# Patient Record
Sex: Female | Born: 1975
Health system: Southern US, Community
[De-identification: ages and names within clinical notes are randomized; demographics above are authoritative.]

## PROBLEM LIST (undated history)

## (undated) DIAGNOSIS — J302 Other seasonal allergic rhinitis: Secondary | ICD-10-CM

## (undated) DIAGNOSIS — J45909 Unspecified asthma, uncomplicated: Secondary | ICD-10-CM

## (undated) DIAGNOSIS — D649 Anemia, unspecified: Secondary | ICD-10-CM

## (undated) DIAGNOSIS — I824Y2 Acute embolism and thrombosis of unspecified deep veins of left proximal lower extremity: Principal | ICD-10-CM

## (undated) HISTORY — DX: Acute embolism and thrombosis of unspecified deep veins of left proximal lower extremity: I82.4Y2

## (undated) HISTORY — PX: TOOTH EXTRACTION: SUR596

## (undated) HISTORY — PX: WISDOM TOOTH EXTRACTION: SHX21

---

## 1998-05-21 ENCOUNTER — Emergency Department (HOSPITAL_COMMUNITY): Admission: EM | Admit: 1998-05-21 | Discharge: 1998-05-21 | Payer: Self-pay | Admitting: Emergency Medicine

## 1999-10-23 ENCOUNTER — Inpatient Hospital Stay (HOSPITAL_COMMUNITY): Admission: EM | Admit: 1999-10-23 | Discharge: 1999-10-23 | Payer: Self-pay | Admitting: *Deleted

## 1999-10-28 ENCOUNTER — Emergency Department (HOSPITAL_COMMUNITY): Admission: EM | Admit: 1999-10-28 | Discharge: 1999-10-28 | Payer: Self-pay | Admitting: Emergency Medicine

## 1999-11-16 ENCOUNTER — Emergency Department (HOSPITAL_COMMUNITY): Admission: EM | Admit: 1999-11-16 | Discharge: 1999-11-16 | Payer: Self-pay | Admitting: Emergency Medicine

## 2001-03-01 ENCOUNTER — Emergency Department (HOSPITAL_COMMUNITY): Admission: EM | Admit: 2001-03-01 | Discharge: 2001-03-01 | Payer: Self-pay

## 2001-03-04 ENCOUNTER — Other Ambulatory Visit: Admission: RE | Admit: 2001-03-04 | Discharge: 2001-03-04 | Payer: Self-pay | Admitting: Obstetrics and Gynecology

## 2001-09-13 ENCOUNTER — Inpatient Hospital Stay (HOSPITAL_COMMUNITY): Admission: AD | Admit: 2001-09-13 | Discharge: 2001-09-15 | Payer: Self-pay | Admitting: Obstetrics and Gynecology

## 2001-10-25 ENCOUNTER — Other Ambulatory Visit: Admission: RE | Admit: 2001-10-25 | Discharge: 2001-10-25 | Payer: Self-pay | Admitting: Obstetrics and Gynecology

## 2002-04-25 ENCOUNTER — Emergency Department (HOSPITAL_COMMUNITY): Admission: EM | Admit: 2002-04-25 | Discharge: 2002-04-25 | Payer: Self-pay

## 2002-11-10 ENCOUNTER — Other Ambulatory Visit: Admission: RE | Admit: 2002-11-10 | Discharge: 2002-11-10 | Payer: Self-pay | Admitting: Obstetrics and Gynecology

## 2002-12-13 ENCOUNTER — Emergency Department (HOSPITAL_COMMUNITY): Admission: EM | Admit: 2002-12-13 | Discharge: 2002-12-13 | Payer: Self-pay | Admitting: Emergency Medicine

## 2003-03-02 ENCOUNTER — Emergency Department (HOSPITAL_COMMUNITY): Admission: EM | Admit: 2003-03-02 | Discharge: 2003-03-02 | Payer: Self-pay | Admitting: Emergency Medicine

## 2003-03-03 ENCOUNTER — Emergency Department (HOSPITAL_COMMUNITY): Admission: EM | Admit: 2003-03-03 | Discharge: 2003-03-03 | Payer: Self-pay | Admitting: Emergency Medicine

## 2003-06-06 ENCOUNTER — Emergency Department (HOSPITAL_COMMUNITY): Admission: EM | Admit: 2003-06-06 | Discharge: 2003-06-06 | Payer: Self-pay | Admitting: Emergency Medicine

## 2003-09-18 ENCOUNTER — Emergency Department (HOSPITAL_COMMUNITY): Admission: EM | Admit: 2003-09-18 | Discharge: 2003-09-18 | Payer: Self-pay | Admitting: Emergency Medicine

## 2003-12-28 ENCOUNTER — Other Ambulatory Visit: Admission: RE | Admit: 2003-12-28 | Discharge: 2003-12-28 | Payer: Self-pay | Admitting: Obstetrics and Gynecology

## 2006-06-17 ENCOUNTER — Emergency Department (HOSPITAL_COMMUNITY): Admission: EM | Admit: 2006-06-17 | Discharge: 2006-06-17 | Payer: Self-pay | Admitting: Emergency Medicine

## 2007-03-03 ENCOUNTER — Emergency Department (HOSPITAL_COMMUNITY): Admission: EM | Admit: 2007-03-03 | Discharge: 2007-03-03 | Payer: Self-pay | Admitting: Emergency Medicine

## 2007-12-01 ENCOUNTER — Emergency Department (HOSPITAL_COMMUNITY): Admission: EM | Admit: 2007-12-01 | Discharge: 2007-12-01 | Payer: Self-pay | Admitting: Emergency Medicine

## 2010-10-04 NOTE — Discharge Summary (Signed)
[facility]  Patient:    Kathleen Cole, Kathleen Cole Visit Number: [id number] MRN: [id number]          Service Type: OBS Location: 910A [id number] Attending Physician:  Wandalee Ferdinand Dictated by:   Rudy Jew Ashley Royalty, M.D. Admit Date:  09/13/2001 Discharge Date: 09/15/2001                             Discharge Summary  DISCHARGE DIAGNOSES:          1. Intrauterine pregnancy at 38-1/[redacted] weeks                                  gestation, delivered.                               2. Asthma.                               3. Term birth living child.  CONSULTATIONS:                None.  DISCHARGE MEDICATIONS:        Advil.  HISTORY AND PHYSICAL:         This is a 35 year old gravida 2, para 1 at 38-1/[redacted] weeks gestation.  Prenatal care was complicated by asthma, for which she took no medications.  Also, complicated by migraine headaches and early breech presentation.  The breech subsequently resolved to a vertex presentation.  The patient presented with the onset of labor approximately four hours prior to admission.  Cervical dilatation in triage was 6-7 cm with some question of presentation.  HOSPITAL COURSE:              The patient was admitted to [facility] [place].  Admission laboratory studies were drawn.  Subsequent pelvic examination revealed the cervix to be anterior low dilatation, definite vertex presentation, and +1 station.  The patient went on to deliver at 4:51 a.m. September 13, 2001.  The infant was a 6 pound 5 ounce female, Apgars 6 at 1 minute and 8 at 5 minutes and sent to newborn nursery.  Delivery was accomplished by Dr. Lonell Face over an intact perineum.  There was a second-degree perineal laceration, which was repaired without difficulty.  The patients postoperative course was benign.  She was discharged on the second postoperative day, afebrile, and in satisfactory condition.  ACCESSORY CLINICAL FINDINGS:  Hemoglobin and  hematocrit on September 14, 2001 were 9.3 and 29.2, respectively.  White blood count was 11,800.  DISPOSITION:                  The patient is to return to [facility] in 4-6 weeks for postpartum evaluation. Dictated by:   Rudy Jew Ashley Royalty, M.D. Attending Physician:  Wandalee Ferdinand DD:  10/20/01 TD:  10/22/01 Job: [id number]

## 2017-08-17 DIAGNOSIS — R102 Pelvic and perineal pain: Secondary | ICD-10-CM | POA: Diagnosis not present

## 2017-08-17 DIAGNOSIS — D259 Leiomyoma of uterus, unspecified: Secondary | ICD-10-CM | POA: Diagnosis not present

## 2017-10-01 ENCOUNTER — Other Ambulatory Visit: Payer: Self-pay | Admitting: Obstetrics and Gynecology

## 2017-10-26 NOTE — Patient Instructions (Addendum)
Your procedure is scheduled on:  Thursday, June 27  Enter through the Main Entrance of [facility] at: 6 am  Pick up the phone at the desk and dial [phone].  Call this number if you have problems the morning of surgery: [phone].  Remember: Do NOT eat food or Do NOT drink clear liquids (including water) after midnight Wednesday.  Take these medicines the morning of surgery with a SIP OF WATER: None  Bring albuterol inhaler with you on day of surgery.  Stop herbal medications, vitamin supplements, ibuprofen/NSAIDS 1 week prior to surgery.  Do NOT wear jewelry (body piercing), metal hair clips/bobby pins, make-up, or nail polish. Do NOT wear lotions, powders, or perfumes.  You may wear deoderant. Do NOT shave for 48 hours prior to surgery. Do NOT bring valuables to the hospital. Contacts may not be worn into surgery.  Leave suitcase in car.  After surgery it may be brought to your room.  For patients admitted to the hospital, checkout time is 11:00 AM the day of discharge. Home with Husband Kathleen Cole cell [phone] or Daughter Kathleen Cole cell [phone].

## 2017-11-04 NOTE — H&P (Signed)
Kathleen Cole is a 42 y.o.   female P: 2-0-0-2 who presents for hysterectomy because of  very large symptomatic uterine fibroids and menorrhagia.  For over 10 years the patient has had enlarging fibroids and describes a menstrual flow lasting for 5 days.  During that flow she will change her pad every 1.5 hours and occasionally, more often,  due to clots .   Pelvic cramping,  rated 10/10 on a 10 point pain scale,  accompanies her period,  but will be relieved to 5/10 with Ibuprofen 800 mg.  She goes on to report urinary frequency due to the pressure of her fibroids, occasional inter-menstrual spotting but denies dyspareunia or constipation.  A  pelvic ultrasound , May 2018 revealed: anteverted uterus ( 28 cm from fundus to external os)  28.61 x 14.65 x 20.78 cm, pedunculated lower uterine quadrant fibroid-8.27 cm; left ovary-4.94 cm and right ovary-not seen.   An endometrial biopsy was not able to be performed on this patient secondary to uterine enlargement displacing her cervix out of view.  A CBC and CMET in January 2018 by her primary care physician was  within normal limits.   Hemoglobin at that time was 11.4.   A review of both medical and cervical management options were given to the patient however, she has decided to proceed with an abdominal hysterectomy for definitive management.   Past Medical History  OB History: G: 2;  P: 2-0-0-2;  SVB: 1995 and 2003  GYN History: menarche: 42 YO    LMP: 10/08/17    Contraception: None;   Denies history of abnormal PAP smear or STDs.   Last PAP smear: 2014-normal (Subsequent PAP Smears were precluded by cervical displacement related to uterine enlargement)  Medical History: Asthma  (never intubated)  Surgical History: None Denies history of blood transfusions  Family History: Breast Cancer and   Hypertension  Social History:  Married and employed with a [employer];  Denies tobacco use and rarely consumes alcohol  Medications: Montelukast 10 mg  daily Ketorolac 10 mg with food every 6 hours prn   Allergy:  Shellfish (tongue itching and swelling)  Denies sensitivity to peanuts,  soy, latex or adhesives.   ROS: Admits to glasses, occasional stress urinary incontinence symptoms, bilateral knee pain and swelling but   denies headache, vision changes, nasal congestion, dysphagia, tinnitus, dizziness, hoarseness, cough,  chest pain, shortness of breath, nausea, vomiting, diarrhea,constipation,  urinary frequency, urgency  dysuria, hematuria, vaginitis symptoms, pelvic pain, easy bruising,  myalgias,  skin rashes, unexplained weight loss and except as is mentioned in the history of present illness, patient's review of systems is otherwise negative.    Physical Exam  Bp: 110/60  P: 48 bpm  Temperature: 98.5 degrees F orally  R: 14  Weight: 176 lbs.  Height: 5' 3.5"  BMI: 30.7  O2Sat. 99% (room air)  Neck: supple without masses or thyromegaly Lungs: clear to auscultation Heart: regular rate and rhythm Abdomen:protuberant with a  firm mass from pelvis to level of xiphoid that is  non-tender Pelvic:EGBUS- wnl; vagina-normal rugae; uterus-enlarged to level of xiphoid, fixed and non-tender.  cervix: not visible (displaced by enlarged uterus;   adnexae-no tenderness with limited assessment due to mass effect of uterus Extremities:  no clubbing, cyanosis or edema   Assesment: Symptomatic Uterine Fibroids                       Menorrhagia   Disposition:  A discussion was held  with patient regarding the indication for her procedure(s) along with the risks, which include but are not limited to: reaction to anesthesia, damage to adjacent organs, infection and excessive bleeding.  The patient verbalized understanding of these risks and has consented to proceed with a Total Abdominal Hysterectomy with Bilateral Salpingectomy at [facility] on November 12, 2017.   CSN# [id number]   Islam Eichinger J. Florene Glen, PA-C  for Dr.Sandra A. Rivard

## 2017-11-06 ENCOUNTER — Other Ambulatory Visit: Payer: Self-pay

## 2017-11-06 ENCOUNTER — Encounter (HOSPITAL_COMMUNITY)
Admission: RE | Admit: 2017-11-06 | Discharge: 2017-11-06 | Disposition: A | Discharge status: Home / Self Care | Payer: BLUE CROSS/BLUE SHIELD | Source: Ambulatory Visit | Attending: Obstetrics and Gynecology | Admitting: Obstetrics and Gynecology

## 2017-11-06 ENCOUNTER — Encounter (HOSPITAL_COMMUNITY): Payer: Self-pay | Admitting: *Deleted

## 2017-11-06 DIAGNOSIS — Z01818 Encounter for other preprocedural examination: Secondary | ICD-10-CM | POA: Insufficient documentation

## 2017-11-06 DIAGNOSIS — D259 Leiomyoma of uterus, unspecified: Secondary | ICD-10-CM | POA: Diagnosis not present

## 2017-11-06 HISTORY — DX: Unspecified asthma, uncomplicated: J45.909

## 2017-11-06 HISTORY — DX: Other seasonal allergic rhinitis: J30.2

## 2017-11-06 HISTORY — DX: Anemia, unspecified: D64.9

## 2017-11-06 LAB — CBC
HCT: 33.7 % — ABNORMAL LOW (ref 36.0–46.0)
Hemoglobin: 10.9 g/dL — ABNORMAL LOW (ref 12.0–15.0)
MCH: 23.7 pg — AB (ref 26.0–34.0)
MCHC: 32.3 g/dL (ref 30.0–36.0)
MCV: 73.4 fL — ABNORMAL LOW (ref 78.0–100.0)
PLATELETS: 234 10*3/uL (ref 150–400)
RBC: 4.59 MIL/uL (ref 3.87–5.11)
RDW: 15.1 % (ref 11.5–15.5)
WBC: 5 10*3/uL (ref 4.0–10.5)

## 2017-11-06 LAB — BASIC METABOLIC PANEL
Anion gap: 9 (ref 5–15)
BUN: 13 mg/dL (ref 6–20)
CALCIUM: 8.8 mg/dL — AB (ref 8.9–10.3)
CO2: 22 mmol/L (ref 22–32)
Chloride: 107 mmol/L (ref 101–111)
Creatinine, Ser: 0.83 mg/dL (ref 0.44–1.00)
GFR calc Af Amer: >60 mL/min (ref >60)
GFR calc non Af Amer: >60 mL/min (ref >60)
GLUCOSE: 84 mg/dL (ref 65–99)
Potassium: 3.9 mmol/L (ref 3.5–5.1)
Sodium: 138 mmol/L (ref 135–145)

## 2017-11-06 LAB — ABO/RH: ABO/RH(D): O POS

## 2017-11-11 ENCOUNTER — Encounter (HOSPITAL_COMMUNITY): Payer: Self-pay | Admitting: Anesthesiology

## 2017-11-11 NOTE — Anesthesia Preprocedure Evaluation (Addendum)
Anesthesia Evaluation  Patient identified by MRN, date of birth, ID band Patient awake    Reviewed: Allergy & Precautions, NPO status , Patient's Chart, lab work & pertinent test results  Airway Mallampati: II  TM Distance: >3 FB Neck ROM: Full    Dental  (+) Chipped,    Pulmonary asthma ,    Pulmonary exam normal breath sounds clear to auscultation       Cardiovascular negative cardio ROS Normal cardiovascular exam Rhythm:Regular Rate:Normal     Neuro/Psych negative neurological ROS  negative psych ROS   GI/Hepatic Neg liver ROS, GERD  Medicated and Poorly Controlled,  Endo/Other  negative endocrine ROS  Renal/GU negative Renal ROS  negative genitourinary   Musculoskeletal negative musculoskeletal ROS (+)   Abdominal   Peds  Hematology  (+) anemia ,   Anesthesia Other Findings   Reproductive/Obstetrics Fibroid uterus                           Anesthesia Physical Anesthesia Plan  ASA: II  Anesthesia Plan: General   Post-op Pain Management:    Induction: Intravenous  PONV Risk Score and Plan: 4 or greater and Scopolamine patch - Pre-op, Midazolam, Dexamethasone, Ondansetron and Treatment may vary due to age or medical condition  Airway Management Planned: Oral ETT  Additional Equipment:   Intra-op Plan:   Post-operative Plan: Extubation in OR  Informed Consent: I have reviewed the patients History and Physical, chart, labs and discussed the procedure including the risks, benefits and alternatives for the proposed anesthesia with the patient or authorized representative who has indicated his/her understanding and acceptance.   Dental advisory given  Plan Discussed with: Surgeon and CRNA  Anesthesia Plan Comments:        Anesthesia Quick Evaluation

## 2017-11-12 ENCOUNTER — Inpatient Hospital Stay (HOSPITAL_COMMUNITY)
Admission: AD | Admit: 2017-11-12 | Discharge: 2017-11-17 | DRG: 742 | Disposition: A | Discharge status: Home / Self Care | Payer: BLUE CROSS/BLUE SHIELD | Source: Ambulatory Visit | Attending: Obstetrics and Gynecology | Admitting: Obstetrics and Gynecology

## 2017-11-12 ENCOUNTER — Inpatient Hospital Stay: Admit: 2017-11-12 | Payer: Self-pay | Admitting: Obstetrics and Gynecology

## 2017-11-12 ENCOUNTER — Inpatient Hospital Stay (HOSPITAL_COMMUNITY): Payer: BLUE CROSS/BLUE SHIELD | Admitting: Certified Registered Nurse Anesthetist

## 2017-11-12 ENCOUNTER — Encounter (HOSPITAL_COMMUNITY): Payer: Self-pay

## 2017-11-12 ENCOUNTER — Inpatient Hospital Stay (HOSPITAL_COMMUNITY): Payer: BLUE CROSS/BLUE SHIELD

## 2017-11-12 ENCOUNTER — Encounter (HOSPITAL_COMMUNITY)
Admission: AD | Disposition: A | Discharge status: Home / Self Care | Payer: Self-pay | Source: Ambulatory Visit | Attending: Obstetrics and Gynecology

## 2017-11-12 ENCOUNTER — Other Ambulatory Visit: Payer: Self-pay

## 2017-11-12 DIAGNOSIS — J9601 Acute respiratory failure with hypoxia: Secondary | ICD-10-CM

## 2017-11-12 DIAGNOSIS — S3729XA Other injury of bladder, initial encounter: Secondary | ICD-10-CM | POA: Diagnosis not present

## 2017-11-12 DIAGNOSIS — J45909 Unspecified asthma, uncomplicated: Secondary | ICD-10-CM | POA: Diagnosis present

## 2017-11-12 DIAGNOSIS — E43 Unspecified severe protein-calorie malnutrition: Secondary | ICD-10-CM | POA: Diagnosis not present

## 2017-11-12 DIAGNOSIS — D259 Leiomyoma of uterus, unspecified: Principal | ICD-10-CM | POA: Diagnosis present

## 2017-11-12 DIAGNOSIS — E876 Hypokalemia: Secondary | ICD-10-CM | POA: Diagnosis not present

## 2017-11-12 DIAGNOSIS — N9961 Intraoperative hemorrhage and hematoma of a genitourinary system organ or structure complicating a genitourinary system procedure: Secondary | ICD-10-CM | POA: Diagnosis not present

## 2017-11-12 DIAGNOSIS — R58 Hemorrhage, not elsewhere classified: Secondary | ICD-10-CM | POA: Diagnosis not present

## 2017-11-12 DIAGNOSIS — R739 Hyperglycemia, unspecified: Secondary | ICD-10-CM | POA: Diagnosis not present

## 2017-11-12 DIAGNOSIS — J95821 Acute postprocedural respiratory failure: Secondary | ICD-10-CM | POA: Diagnosis not present

## 2017-11-12 DIAGNOSIS — G9341 Metabolic encephalopathy: Secondary | ICD-10-CM | POA: Diagnosis not present

## 2017-11-12 DIAGNOSIS — N9962 Intraoperative hemorrhage and hematoma of a genitourinary system organ or structure complicating other procedure: Secondary | ICD-10-CM | POA: Diagnosis not present

## 2017-11-12 DIAGNOSIS — N3289 Other specified disorders of bladder: Secondary | ICD-10-CM | POA: Diagnosis not present

## 2017-11-12 DIAGNOSIS — A1839 Retroperitoneal tuberculosis: Secondary | ICD-10-CM | POA: Diagnosis not present

## 2017-11-12 DIAGNOSIS — D62 Acute posthemorrhagic anemia: Secondary | ICD-10-CM | POA: Diagnosis not present

## 2017-11-12 DIAGNOSIS — Z4682 Encounter for fitting and adjustment of non-vascular catheter: Secondary | ICD-10-CM | POA: Diagnosis not present

## 2017-11-12 DIAGNOSIS — Z6830 Body mass index (BMI) 30.0-30.9, adult: Secondary | ICD-10-CM | POA: Diagnosis not present

## 2017-11-12 DIAGNOSIS — D696 Thrombocytopenia, unspecified: Secondary | ICD-10-CM | POA: Diagnosis present

## 2017-11-12 DIAGNOSIS — R578 Other shock: Secondary | ICD-10-CM | POA: Diagnosis not present

## 2017-11-12 DIAGNOSIS — K219 Gastro-esophageal reflux disease without esophagitis: Secondary | ICD-10-CM | POA: Diagnosis present

## 2017-11-12 DIAGNOSIS — D282 Benign neoplasm of uterine tubes and ligaments: Secondary | ICD-10-CM | POA: Diagnosis present

## 2017-11-12 DIAGNOSIS — I862 Pelvic varices: Secondary | ICD-10-CM | POA: Diagnosis not present

## 2017-11-12 DIAGNOSIS — N920 Excessive and frequent menstruation with regular cycle: Secondary | ICD-10-CM | POA: Diagnosis not present

## 2017-11-12 DIAGNOSIS — R319 Hematuria, unspecified: Secondary | ICD-10-CM | POA: Diagnosis present

## 2017-11-12 DIAGNOSIS — Z91013 Allergy to seafood: Secondary | ICD-10-CM | POA: Diagnosis not present

## 2017-11-12 DIAGNOSIS — Y658 Other specified misadventures during surgical and medical care: Secondary | ICD-10-CM | POA: Diagnosis not present

## 2017-11-12 DIAGNOSIS — A91 Dengue hemorrhagic fever: Secondary | ICD-10-CM | POA: Diagnosis not present

## 2017-11-12 DIAGNOSIS — Z4659 Encounter for fitting and adjustment of other gastrointestinal appliance and device: Secondary | ICD-10-CM

## 2017-11-12 DIAGNOSIS — R8589 Other abnormal findings in specimens from digestive organs and abdominal cavity: Secondary | ICD-10-CM | POA: Diagnosis not present

## 2017-11-12 HISTORY — PX: HYSTERECTOMY ABDOMINAL WITH SALPINGECTOMY: SHX6725

## 2017-11-12 LAB — POCT I-STAT EG7
ACID-BASE DEFICIT: 4 mmol/L — AB (ref 0.0–2.0)
Acid-base deficit: 4 mmol/L — ABNORMAL HIGH (ref 0.0–2.0)
BICARBONATE: 21.2 mmol/L (ref 20.0–28.0)
Bicarbonate: 20.9 mmol/L (ref 20.0–28.0)
Calcium, Ion: 1.04 mmol/L — ABNORMAL LOW (ref 1.15–1.40)
Calcium, Ion: 0.9 mmol/L — ABNORMAL LOW (ref 1.15–1.40)
HCT: 23 % — ABNORMAL LOW (ref 36.0–46.0)
HCT: 19 % — ABNORMAL LOW (ref 36.0–46.0)
Hemoglobin: 7.8 g/dL — ABNORMAL LOW (ref 12.0–15.0)
Hemoglobin: 6.5 g/dL — CL (ref 12.0–15.0)
O2 SAT: 100 %
O2 Saturation: 100 %
PCO2 VEN: 36.3 mmHg — AB (ref 44.0–60.0)
PH VEN: 7.374 (ref 7.250–7.430)
PO2 VEN: 245 mmHg — AB (ref 32.0–45.0)
Potassium: 4 mmol/L (ref 3.5–5.1)
Potassium: 4 mmol/L (ref 3.5–5.1)
Sodium: 141 mmol/L (ref 135–145)
Sodium: 142 mmol/L (ref 135–145)
TCO2: 22 mmol/L (ref 22–32)
TCO2: 22 mmol/L (ref 22–32)
pCO2, Ven: 34.7 mmHg — ABNORMAL LOW (ref 44.0–60.0)
pH, Ven: 7.387 (ref 7.250–7.430)
pO2, Ven: 181 mmHg — ABNORMAL HIGH (ref 32.0–45.0)

## 2017-11-12 LAB — POCT I-STAT 3, ART BLOOD GAS (G3+)
Acid-base deficit: 4 mmol/L — ABNORMAL HIGH (ref 0.0–2.0)
Bicarbonate: 19.8 mmol/L — ABNORMAL LOW (ref 20.0–28.0)
O2 SAT: 100 %
PH ART: 7.444 (ref 7.350–7.450)
Patient temperature: 98.7
TCO2: 21 mmol/L — AB (ref 22–32)
pCO2 arterial: 28.9 mmHg — ABNORMAL LOW (ref 32.0–48.0)
pO2, Arterial: 210 mmHg — ABNORMAL HIGH (ref 83.0–108.0)

## 2017-11-12 LAB — PROCALCITONIN: Procalcitonin: <0.1 ng/mL

## 2017-11-12 LAB — PREPARE RBC (CROSSMATCH)

## 2017-11-12 LAB — CBC
HCT: 29.5 % — ABNORMAL LOW (ref 36.0–46.0)
HEMATOCRIT: 30.2 % — AB (ref 36.0–46.0)
HEMATOCRIT: 26.1 % — AB (ref 36.0–46.0)
HEMOGLOBIN: 10.1 g/dL — AB (ref 12.0–15.0)
HEMOGLOBIN: 9.1 g/dL — AB (ref 12.0–15.0)
Hemoglobin: 9.9 g/dL — ABNORMAL LOW (ref 12.0–15.0)
MCH: 27.9 pg (ref 26.0–34.0)
MCH: 28.4 pg (ref 26.0–34.0)
MCH: 28.6 pg (ref 26.0–34.0)
MCHC: 33.6 g/dL (ref 30.0–36.0)
MCHC: 33.4 g/dL (ref 30.0–36.0)
MCHC: 34.9 g/dL (ref 30.0–36.0)
MCV: 83.1 fL (ref 78.0–100.0)
MCV: 84.8 fL (ref 78.0–100.0)
MCV: 82.1 fL (ref 78.0–100.0)
PLATELETS: 67 10*3/uL — AB (ref 150–400)
Platelets: 107 10*3/uL — ABNORMAL LOW (ref 150–400)
Platelets: 69 10*3/uL — ABNORMAL LOW (ref 150–400)
RBC: 3.55 MIL/uL — ABNORMAL LOW (ref 3.87–5.11)
RBC: 3.56 MIL/uL — ABNORMAL LOW (ref 3.87–5.11)
RBC: 3.18 MIL/uL — ABNORMAL LOW (ref 3.87–5.11)
RDW: 16.9 % — AB (ref 11.5–15.5)
RDW: 15.3 % (ref 11.5–15.5)
RDW: 15.2 % (ref 11.5–15.5)
WBC: 12.4 10*3/uL — ABNORMAL HIGH (ref 4.0–10.5)
WBC: 10.9 10*3/uL — AB (ref 4.0–10.5)
WBC: 10.5 10*3/uL (ref 4.0–10.5)

## 2017-11-12 LAB — COMPREHENSIVE METABOLIC PANEL
ALBUMIN: 2.1 g/dL — AB (ref 3.5–5.0)
ALK PHOS: 28 U/L — AB (ref 38–126)
ALT: 8 U/L (ref 0–44)
ALT: 12 U/L (ref 0–44)
ANION GAP: 3 — AB (ref 5–15)
AST: 13 U/L — ABNORMAL LOW (ref 15–41)
AST: 18 U/L (ref 15–41)
Albumin: 2.7 g/dL — ABNORMAL LOW (ref 3.5–5.0)
Alkaline Phosphatase: 27 U/L — ABNORMAL LOW (ref 38–126)
Anion gap: 8 (ref 5–15)
BILIRUBIN TOTAL: 0.6 mg/dL (ref 0.3–1.2)
BUN: 8 mg/dL (ref 6–20)
BUN: 7 mg/dL (ref 6–20)
CALCIUM: 7.3 mg/dL — AB (ref 8.9–10.3)
CO2: 20 mmol/L — ABNORMAL LOW (ref 22–32)
CO2: 20 mmol/L — AB (ref 22–32)
CREATININE: 0.99 mg/dL (ref 0.44–1.00)
Calcium: 6.2 mg/dL — CL (ref 8.9–10.3)
Chloride: 113 mmol/L — ABNORMAL HIGH (ref 98–111)
Chloride: 113 mmol/L — ABNORMAL HIGH (ref 98–111)
Creatinine, Ser: 0.81 mg/dL (ref 0.44–1.00)
GFR calc Af Amer: >60 mL/min (ref >60)
GFR calc Af Amer: >60 mL/min (ref >60)
GFR calc non Af Amer: >60 mL/min (ref >60)
GFR calc non Af Amer: >60 mL/min (ref >60)
GLUCOSE: 185 mg/dL — AB (ref 70–99)
Glucose, Bld: 148 mg/dL — ABNORMAL HIGH (ref 70–99)
POTASSIUM: 4 mmol/L (ref 3.5–5.1)
Potassium: 3.9 mmol/L (ref 3.5–5.1)
SODIUM: 136 mmol/L (ref 135–145)
SODIUM: 141 mmol/L (ref 135–145)
TOTAL PROTEIN: 3.4 g/dL — AB (ref 6.5–8.1)
Total Bilirubin: 0.9 mg/dL (ref 0.3–1.2)
Total Protein: 4.6 g/dL — ABNORMAL LOW (ref 6.5–8.1)

## 2017-11-12 LAB — DIC (DISSEMINATED INTRAVASCULAR COAGULATION)PANEL
D-Dimer, Quant: 1.53 ug/mL-FEU — ABNORMAL HIGH (ref 0.00–0.50)
Fibrinogen: 136 mg/dL — ABNORMAL LOW (ref 210–475)
INR: 1.66
Platelets: 107 10*3/uL — ABNORMAL LOW (ref 150–400)
Prothrombin Time: 19.5 seconds — ABNORMAL HIGH (ref 11.4–15.2)
Smear Review: NONE SEEN
aPTT: 35 seconds (ref 24–36)

## 2017-11-12 LAB — MRSA PCR SCREENING: MRSA by PCR: NEGATIVE

## 2017-11-12 LAB — PROTIME-INR
INR: 1.25
Prothrombin Time: 15.6 seconds — ABNORMAL HIGH (ref 11.4–15.2)

## 2017-11-12 LAB — TRIGLYCERIDES: TRIGLYCERIDES: 55 mg/dL (ref <150)

## 2017-11-12 LAB — MAGNESIUM: Magnesium: 1.4 mg/dL — ABNORMAL LOW (ref 1.7–2.4)

## 2017-11-12 LAB — PHOSPHORUS: PHOSPHORUS: 1.9 mg/dL — AB (ref 2.5–4.6)

## 2017-11-12 LAB — PREGNANCY, URINE: Preg Test, Ur: NEGATIVE

## 2017-11-12 LAB — MASSIVE TRANSFUSION PROTOCOL ORDER (BLOOD BANK NOTIFICATION)

## 2017-11-12 LAB — GLUCOSE, CAPILLARY: Glucose-Capillary: 161 mg/dL — ABNORMAL HIGH (ref 70–99)

## 2017-11-12 LAB — TROPONIN I: Troponin I: <0.03 ng/mL (ref <0.03)

## 2017-11-12 LAB — ABO/RH: ABO/RH(D): O POS

## 2017-11-12 SURGERY — HYSTERECTOMY, TOTAL, ABDOMINAL, WITH SALPINGECTOMY
Anesthesia: General | Site: Ureter | Laterality: Bilateral

## 2017-11-12 MED ORDER — PROPOFOL 500 MG/50ML IV EMUL
INTRAVENOUS | Status: AC
Start: 2017-11-12 — End: ?
  Filled 2017-11-12: qty 50

## 2017-11-12 MED ORDER — MIDAZOLAM HCL 2 MG/2ML IJ SOLN
INTRAMUSCULAR | Status: AC
  Filled 2017-11-12: qty 2

## 2017-11-12 MED ORDER — PHENYLEPHRINE HCL 10 MG/ML IJ SOLN
INTRAMUSCULAR | Status: DC | PRN
  Administered 2017-11-12: 80 ug via INTRAVENOUS
  Administered 2017-11-12: 40 ug via INTRAVENOUS

## 2017-11-12 MED ORDER — ALBUMIN HUMAN 5 % IV SOLN
INTRAVENOUS | Status: AC
  Filled 2017-11-12: qty 250

## 2017-11-12 MED ORDER — ORAL CARE MOUTH RINSE
15.0000 mL | OROMUCOSAL | Status: DC
  Administered 2017-11-12 – 2017-11-13 (×9): 15 mL via OROMUCOSAL

## 2017-11-12 MED ORDER — GLYCOPYRROLATE 0.2 MG/ML IJ SOLN
INTRAMUSCULAR | Status: AC
  Filled 2017-11-12: qty 1

## 2017-11-12 MED ORDER — KETOROLAC TROMETHAMINE 30 MG/ML IJ SOLN
INTRAMUSCULAR | Status: AC
  Filled 2017-11-12: qty 1

## 2017-11-12 MED ORDER — LACTATED RINGERS IV SOLN
INTRAVENOUS | Status: DC
  Administered 2017-11-12: 09:00:00 via INTRAVENOUS
  Administered 2017-11-12: 125 mL/h via INTRAVENOUS
  Administered 2017-11-12: 08:00:00 via INTRAVENOUS

## 2017-11-12 MED ORDER — DEXAMETHASONE SODIUM PHOSPHATE 10 MG/ML IJ SOLN
INTRAMUSCULAR | Status: DC | PRN
  Administered 2017-11-12: 4 mg via INTRAVENOUS

## 2017-11-12 MED ORDER — FENTANYL CITRATE (PF) 250 MCG/5ML IJ SOLN
INTRAMUSCULAR | Status: AC
  Filled 2017-11-12: qty 5

## 2017-11-12 MED ORDER — BUPIVACAINE HCL (PF) 0.25 % IJ SOLN
INTRAMUSCULAR | Status: DC | PRN
  Administered 2017-11-12: 30 mL
  Administered 2017-11-12: 8 mL

## 2017-11-12 MED ORDER — PHENYLEPHRINE 40 MCG/ML (10ML) SYRINGE FOR IV PUSH (FOR BLOOD PRESSURE SUPPORT)
PREFILLED_SYRINGE | INTRAVENOUS | Status: AC
  Filled 2017-11-12: qty 10

## 2017-11-12 MED ORDER — ONDANSETRON HCL 4 MG/2ML IJ SOLN
INTRAMUSCULAR | Status: AC
  Filled 2017-11-12: qty 2

## 2017-11-12 MED ORDER — FENTANYL BOLUS VIA INFUSION
50.0000 ug | INTRAVENOUS | Status: DC | PRN
  Administered 2017-11-12 – 2017-11-13 (×2): 50 ug via INTRAVENOUS
  Filled 2017-11-12: qty 50

## 2017-11-12 MED ORDER — CHLORHEXIDINE GLUCONATE 0.12% ORAL RINSE (MEDLINE KIT)
15.0000 mL | Freq: Two times a day (BID) | OROMUCOSAL | Status: DC
  Administered 2017-11-12 – 2017-11-13 (×3): 15 mL via OROMUCOSAL

## 2017-11-12 MED ORDER — ROCURONIUM BROMIDE 100 MG/10ML IV SOLN
INTRAVENOUS | Status: AC
  Filled 2017-11-12: qty 1

## 2017-11-12 MED ORDER — SODIUM CHLORIDE 0.9 % IV SOLN
INTRAVENOUS | Status: AC
  Filled 2017-11-12: qty 2

## 2017-11-12 MED ORDER — SODIUM CHLORIDE 0.9 % IV SOLN
INTRAVENOUS | Status: DC | PRN
  Administered 2017-11-12 (×4): via INTRAVENOUS

## 2017-11-12 MED ORDER — PROPOFOL 1000 MG/100ML IV EMUL
0.0000 ug/kg/min | INTRAVENOUS | Status: DC
  Administered 2017-11-12 – 2017-11-13 (×2): 50 ug/kg/min via INTRAVENOUS
  Filled 2017-11-12 (×4): qty 100

## 2017-11-12 MED ORDER — LACTATED RINGERS IV SOLN
INTRAVENOUS | Status: DC
  Administered 2017-11-12: 16:00:00 via INTRAVENOUS

## 2017-11-12 MED ORDER — METHYLENE BLUE 0.5 % INJ SOLN
INTRAVENOUS | Status: DC | PRN
  Administered 2017-11-12: 5 mL via INTRAVENOUS

## 2017-11-12 MED ORDER — FAMOTIDINE IN NACL 20-0.9 MG/50ML-% IV SOLN: 20.0000 mg | Freq: Two times a day (BID) | INTRAVENOUS | Status: DC

## 2017-11-12 MED ORDER — FENTANYL CITRATE (PF) 100 MCG/2ML IJ SOLN
INTRAMUSCULAR | Status: DC | PRN
  Administered 2017-11-12 (×2): 100 ug via INTRAVENOUS
  Administered 2017-11-12 (×2): 50 ug via INTRAVENOUS
  Administered 2017-11-12 (×2): 100 ug via INTRAVENOUS

## 2017-11-12 MED ORDER — ONDANSETRON HCL 4 MG/2ML IJ SOLN
INTRAMUSCULAR | Status: DC | PRN
  Administered 2017-11-12: 4 mg via INTRAVENOUS

## 2017-11-12 MED ORDER — SODIUM CHLORIDE 0.9 % IV SOLN
1.0000 g | Freq: Once | INTRAVENOUS | Status: AC
  Administered 2017-11-12: 1 g via INTRAVENOUS
  Filled 2017-11-12 (×2): qty 10

## 2017-11-12 MED ORDER — SODIUM CHLORIDE 0.9 % IV SOLN
250.0000 mL | INTRAVENOUS | Status: DC | PRN
  Administered 2017-11-12: 250 mL via INTRAVENOUS

## 2017-11-12 MED ORDER — SODIUM CHLORIDE 0.9 % IV SOLN
2.0000 g | Freq: Two times a day (BID) | INTRAVENOUS | Status: DC
  Administered 2017-11-12 – 2017-11-13 (×4): 2 g via INTRAVENOUS
  Filled 2017-11-12 (×9): qty 2

## 2017-11-12 MED ORDER — METHYLENE BLUE 0.5 % INJ SOLN
INTRAVENOUS | Status: AC
  Filled 2017-11-12: qty 10

## 2017-11-12 MED ORDER — BUPIVACAINE HCL (PF) 0.25 % IJ SOLN
INTRAMUSCULAR | Status: AC
  Filled 2017-11-12: qty 30

## 2017-11-12 MED ORDER — DEXAMETHASONE SODIUM PHOSPHATE 4 MG/ML IJ SOLN
INTRAMUSCULAR | Status: AC
  Filled 2017-11-12: qty 1

## 2017-11-12 MED ORDER — EPHEDRINE 5 MG/ML INJ
INTRAVENOUS | Status: AC
  Filled 2017-11-12: qty 10

## 2017-11-12 MED ORDER — FENTANYL 2500MCG IN NS 250ML (10MCG/ML) PREMIX INFUSION
25.0000 ug/h | INTRAVENOUS | Status: DC
  Administered 2017-11-12: 50 ug/h via INTRAVENOUS
  Filled 2017-11-12: qty 250

## 2017-11-12 MED ORDER — PROPOFOL 1000 MG/100ML IV EMUL
5.0000 ug/kg/min | INTRAVENOUS | Status: DC
  Filled 2017-11-12: qty 100

## 2017-11-12 MED ORDER — SODIUM CHLORIDE 0.9 % IV SOLN
2.0000 g | INTRAVENOUS | Status: AC
  Administered 2017-11-12 (×2): 2 g via INTRAVENOUS

## 2017-11-12 MED ORDER — PHENYLEPHRINE HCL 10 MG/ML IJ SOLN
INTRAVENOUS | Status: DC | PRN
  Administered 2017-11-12: 60 ug/min via INTRAVENOUS

## 2017-11-12 MED ORDER — PROPOFOL 500 MG/50ML IV EMUL
INTRAVENOUS | Status: AC
  Filled 2017-11-12: qty 50

## 2017-11-12 MED ORDER — PROPOFOL 10 MG/ML IV BOLUS
INTRAVENOUS | Status: DC | PRN
  Administered 2017-11-12: 180 mg via INTRAVENOUS

## 2017-11-12 MED ORDER — LIDOCAINE HCL (CARDIAC) PF 100 MG/5ML IV SOSY
PREFILLED_SYRINGE | INTRAVENOUS | Status: DC | PRN
  Administered 2017-11-12: 100 mg via INTRAVENOUS

## 2017-11-12 MED ORDER — FAMOTIDINE IN NACL 20-0.9 MG/50ML-% IV SOLN
20.0000 mg | Freq: Two times a day (BID) | INTRAVENOUS | Status: DC
  Administered 2017-11-12 – 2017-11-13 (×4): 20 mg via INTRAVENOUS
  Filled 2017-11-12 (×4): qty 50

## 2017-11-12 MED ORDER — CALCIUM GLUCONATE 10 % IV SOLN
INTRAVENOUS | Status: AC
  Filled 2017-11-12: qty 10

## 2017-11-12 MED ORDER — GLYCOPYRROLATE 0.2 MG/ML IJ SOLN
INTRAMUSCULAR | Status: DC | PRN
  Administered 2017-11-12: 0.1 mg via INTRAVENOUS

## 2017-11-12 MED ORDER — POTASSIUM PHOSPHATES 15 MMOLE/5ML IV SOLN
30.0000 mmol | Freq: Once | INTRAVENOUS | Status: AC
  Administered 2017-11-12: 30 mmol via INTRAVENOUS
  Filled 2017-11-12 (×2): qty 10

## 2017-11-12 MED ORDER — PROPOFOL 10 MG/ML IV BOLUS
INTRAVENOUS | Status: AC
  Filled 2017-11-12: qty 20

## 2017-11-12 MED ORDER — ALBUMIN HUMAN 5 % IV SOLN
INTRAVENOUS | Status: DC | PRN
  Administered 2017-11-12 (×2): via INTRAVENOUS

## 2017-11-12 MED ORDER — MAGNESIUM SULFATE 2 GM/50ML IV SOLN
2.0000 g | Freq: Once | INTRAVENOUS | Status: AC
  Administered 2017-11-12: 2 g via INTRAVENOUS
  Filled 2017-11-12: qty 50

## 2017-11-12 MED ORDER — SCOPOLAMINE 1 MG/3DAYS TD PT72
1.0000 | MEDICATED_PATCH | Freq: Once | TRANSDERMAL | Status: DC
  Administered 2017-11-12: 1.5 mg via TRANSDERMAL

## 2017-11-12 MED ORDER — SUGAMMADEX SODIUM 200 MG/2ML IV SOLN
INTRAVENOUS | Status: AC
  Filled 2017-11-12: qty 2

## 2017-11-12 MED ORDER — ROCURONIUM BROMIDE 100 MG/10ML IV SOLN
INTRAVENOUS | Status: DC | PRN
  Administered 2017-11-12: 20 mg via INTRAVENOUS
  Administered 2017-11-12: 50 mg via INTRAVENOUS
  Administered 2017-11-12: 30 mg via INTRAVENOUS
  Administered 2017-11-12 (×2): 20 mg via INTRAVENOUS
  Administered 2017-11-12: 10 mg via INTRAVENOUS

## 2017-11-12 MED ORDER — PHENYLEPHRINE HCL 10 MG/ML IJ SOLN
INTRAMUSCULAR | Status: AC
  Filled 2017-11-12: qty 1

## 2017-11-12 MED ORDER — EPHEDRINE SULFATE 50 MG/ML IJ SOLN
INTRAMUSCULAR | Status: DC | PRN
  Administered 2017-11-12: 5 mg via INTRAVENOUS

## 2017-11-12 MED ORDER — LIDOCAINE HCL (CARDIAC) PF 100 MG/5ML IV SOSY
PREFILLED_SYRINGE | INTRAVENOUS | Status: AC
  Filled 2017-11-12: qty 5

## 2017-11-12 MED ORDER — CALCIUM GLUCONATE 10 % IV SOLN
INTRAVENOUS | Status: DC | PRN
  Administered 2017-11-12: 1 g via INTRAVENOUS

## 2017-11-12 MED ORDER — CEFOTETAN DISODIUM 2 G IJ SOLR: 2.0000 g | Freq: Two times a day (BID) | INTRAMUSCULAR | Status: DC

## 2017-11-12 MED ORDER — MIDAZOLAM HCL 2 MG/2ML IJ SOLN
INTRAMUSCULAR | Status: DC | PRN
  Administered 2017-11-12: 4 mg via INTRAVENOUS
  Administered 2017-11-12: 2 mg via INTRAVENOUS

## 2017-11-12 MED ORDER — SCOPOLAMINE 1 MG/3DAYS TD PT72
MEDICATED_PATCH | TRANSDERMAL | Status: AC
  Filled 2017-11-12: qty 1

## 2017-11-12 MED ORDER — MIDAZOLAM HCL 2 MG/2ML IJ SOLN
INTRAMUSCULAR | Status: AC
  Filled 2017-11-12: qty 4

## 2017-11-12 MED ORDER — FENTANYL CITRATE (PF) 100 MCG/2ML IJ SOLN: 50.0000 ug | Freq: Once | INTRAMUSCULAR | Status: DC

## 2017-11-12 SURGICAL SUPPLY — 55 items
APL SKNCLS STERI-STRIP NONHPOA (GAUZE/BANDAGES/DRESSINGS) ×3
BAG URINE DRAINAGE (UROLOGICAL SUPPLIES) ×3 IMPLANT
BENZOIN TINCTURE PRP APPL 2/3 (GAUZE/BANDAGES/DRESSINGS) ×5 IMPLANT
CANISTER SUCT 3000ML PPV (MISCELLANEOUS) ×5 IMPLANT
CATH FOLEY 2W 5CC 18FR LF (CATHETERS) ×3 IMPLANT
CATH FOLEY 2WAY SLVR  5CC 18FR (CATHETERS) ×2
CATH FOLEY 2WAY SLVR 5CC 18FR (CATHETERS) ×1 IMPLANT
CATH URET 5FR 28IN OPEN ENDED (CATHETERS) ×6 IMPLANT
CELLS DAT CNTRL 66122 CELL SVR (MISCELLANEOUS) IMPLANT
CLOSURE WOUND 1/2 X4 (GAUZE/BANDAGES/DRESSINGS) ×1
DECANTER SPIKE VIAL GLASS SM (MISCELLANEOUS) ×6 IMPLANT
DISSECTOR SPONGE CHERRY (GAUZE/BANDAGES/DRESSINGS) ×5 IMPLANT
DRAPE WARM FLUID 44X44 (DRAPE) ×5 IMPLANT
DRSG OPSITE POSTOP 4X10 (GAUZE/BANDAGES/DRESSINGS) ×5 IMPLANT
DURAPREP 26ML APPLICATOR (WOUND CARE) ×5 IMPLANT
GAUZE SPONGE 4X4 16PLY XRAY LF (GAUZE/BANDAGES/DRESSINGS) ×3 IMPLANT
GLOVE BIOGEL PI IND STRL 7.0 (GLOVE) ×9 IMPLANT
GLOVE BIOGEL PI INDICATOR 7.0 (GLOVE) ×6
GLOVE ECLIPSE 6.5 STRL STRAW (GLOVE) ×5 IMPLANT
GOWN STRL REUS W/TWL LRG LVL3 (GOWN DISPOSABLE) ×15 IMPLANT
GUIDEWIRE STR DUAL SENSOR (WIRE) ×3 IMPLANT
NEEDLE HYPO 22GX1.5 SAFETY (NEEDLE) ×5 IMPLANT
NS IRRIG 1000ML POUR BTL (IV SOLUTION) ×5 IMPLANT
PACK ABDOMINAL GYN (CUSTOM PROCEDURE TRAY) ×5 IMPLANT
PAD OB MATERNITY 4.3X12.25 (PERSONAL CARE ITEMS) ×5 IMPLANT
PENCIL SMOKE EVAC W/HOLSTER (ELECTROSURGICAL) ×5 IMPLANT
PROTECTOR NERVE ULNAR (MISCELLANEOUS) ×5 IMPLANT
RETRACTOR WND ALEXIS 18 MED (MISCELLANEOUS) IMPLANT
RETRACTOR WND ALEXIS 25 LRG (MISCELLANEOUS) IMPLANT
RTRCTR WOUND ALEXIS 18CM MED (MISCELLANEOUS)
RTRCTR WOUND ALEXIS 25CM LRG (MISCELLANEOUS)
SPONGE LAP 18X18 X RAY DECT (DISPOSABLE) ×35 IMPLANT
STAPLER VISISTAT 35W (STAPLE) IMPLANT
STRIP CLOSURE SKIN 1/2X4 (GAUZE/BANDAGES/DRESSINGS) ×4 IMPLANT
SUT CHROMIC 2 0 SH (SUTURE) ×9 IMPLANT
SUT MNCRL AB 3-0 PS2 27 (SUTURE) ×5 IMPLANT
SUT PLAIN 2 0 XLH (SUTURE) ×3 IMPLANT
SUT PLAIN 3 0 CT 1 27 (SUTURE) IMPLANT
SUT PROLENE 3 0 SH DA (SUTURE) IMPLANT
SUT PROLENE 5 0 P 3 (SUTURE) ×3 IMPLANT
SUT SILK 0 SH 30 (SUTURE) ×3 IMPLANT
SUT SILK 2 0 SH CR/8 (SUTURE) ×3 IMPLANT
SUT VIC AB 0 CT1 18XCR BRD8 (SUTURE) ×11 IMPLANT
SUT VIC AB 0 CT1 27 (SUTURE) ×40
SUT VIC AB 0 CT1 27XBRD ANBCTR (SUTURE) ×16 IMPLANT
SUT VIC AB 0 CT1 36 (SUTURE) ×6 IMPLANT
SUT VIC AB 0 CT1 8-18 (SUTURE) ×25
SUT VIC AB 2-0 SH 27 (SUTURE) ×25
SUT VIC AB 2-0 SH 27XBRD (SUTURE) ×5 IMPLANT
SUT VIC AB 3-0 SH 27 (SUTURE) ×5
SUT VIC AB 3-0 SH 27X BRD (SUTURE) ×1 IMPLANT
SUT VICRYL 0 TIES 12 18 (SUTURE) ×5 IMPLANT
SYR CONTROL 10ML LL (SYRINGE) ×5 IMPLANT
TOWEL OR 17X24 6PK STRL BLUE (TOWEL DISPOSABLE) ×15 IMPLANT
TRAY FOLEY W/BAG SLVR 14FR (SET/KITS/TRAYS/PACK) ×5 IMPLANT

## 2017-11-12 NOTE — Op Note (Signed)
Preoperative diagnosis: Large uterine fibroids  Postoperative diagnosis: Large retroperitoneal fibroid, cystostomy, right retroperitoneal bleeding   Anesthesia: General  Anesthesiologist: Dr. Royce Macadamia  Procedure: Total abdominal hysterectomy with bilateral salpingectomy  Surgeon: Dr. Cletis Media  Asst.: Earnstine Regal PA-C  Estimated blood loss: 4500  Cc requiring Massive Transfusion Protocol  Added Procedures: Repair of cystostomy with bilateral ureteral catheterization Dr Karsten Ro                                    Repair of vascular injury of the right retroperitoneal space Dr Scot Dock  Procedure:  After being informed of the planned procedure with possible complications including but not limited to bleeding, infection, injury to other organs, informed consent is obtained and patient is taken to or #7. She is placed in the dorsal decubitus position, prepped and draped in a sterile fashion. A Foley catheter is inserted in her bladder.  We infiltrate the midline area using 30 cc of Marcaine 0.25 and perform a vertical  incision up to 3 cm above the umbilicus then  brought down sharply to the fascia. The fascia is incised in a midline fashion. Linea alba is dissected and peritoneum is entered bluntly.   Observation: we are unable to recognize the anatomy at at this time but with palpation and manual evaluation we note that there is a small normal appearing uterus sitting on the left upper portion of a massive retroperitoneal fibroid which is responsible for the 33 week size uterus initially appreciated. Both tubes and ovaries are normal and in a normal position in relationship to the small normal-appearing uterus. Later we are able to visualize the appendix which is normal and palpates a normal liver  We exteriorized most of the mass in order to start the procedure. The round ligament is  Easily identified on each side, clamped with a Rogers forceps, and sutured with a transfix suture of 0  Vicryl.this allows [place] to clamp the utero-ovarian ligaments on each side with Rogers clamp sectioned and sutured them with a transfix suture of 0 Vicryl and a free tie of 0 Vicryl. Each tube is then removed after clamping the mesosalpinx using Rogers forceps and tying the pedicle with a transfix suture of 0 Vicryl and a free tie of 0 Vicryl. At this time were still sitting on top of that 30 cm mass with no access to the pelvic walls. The broad ligament is then incised in front of the mass sharply. We attempt to bring down that broad ligament-serosa to develop the bladder flap but in the process create a 4 cm cystostomy. This opening is then used to continued dissecting the bladder away from the pelvic mass. We then proceed systematically in clamping and sectioning and suturing the serosa on each side of the mass so we can bring down both ovaries with there are vascular pedicle. With traction on the mass we now identify the right ureter starting at the pelvic brim to realized its course is now attached to the mass moving upward and anteriorly with a 90 kink going back down towards the pelvic wall around the lower part of the mass and into the bladder. The ureter is dilated and thin-walled but intact. We proceed with complete dissection off the mass by releasing the serosa. We can now evaluate its full course all the way to the bladder and it continues to appear intact. It is however difficult to see any peristaltic  activity. We then clamp what appears to be the vascular pedicles of the uterus at the lower uterine segment still sitting on top of that pelvic mass the pedicles are clamped with Rogers clamp, sectioned, sutured with transfix suture of 0 Vicryl. Again these pedicles require to be peeled away from the pelvic mass and this is achieved by clamping sectioning and suturing the serosa overlying the mass. It is impossible to identify the cervix at this point. But we now have direct visualization of the posterior  cul-de-sac and we can easily palpate the uterosacral ligaments on each side. Those ligaments are clamped with Rogers clamp, sectioned and sutured with a transfix suture of 0 Vicryl. There is active bleeding at the area of bladder dissection in the front as well as on the edges of the cystostomy. This is packed. We decided to section the body of the mass above the uterosacral ligaments using a cold knife in order to have better access to the pelvis. The uterus is then removed, we assumed and entirely,with the large fibroid which is now identified as a retroperitoneal fibroid. After sectioning, we identify the opening of the vagina. Palpation with finger reveals a normal vaginal canal with no cervical stump. The vaginal cuff is then closed with a running stitch lock suture of 0 Vicryl. We proceed with some hemostasis on the anterior aspect of the vaginal canal using multiple figure-of-eight stitches of 2-0 Vicryl as well as cauterization. The right ureter which has been dissected completely is again evaluated and intact. We are unable to visualize and/or identify the left ureter. Urology is called and a separate op note has been dictated by Dr. Karsten Ro who proceeded with catheterization of both ureters through the open bladder confirming integrity of both ureters. He dense proceed with closing of the cystostomy.  As we proceed to complete hemostasis significant bleeding is identified on the right pelvic wall below the ureter in what appears to be a pelvic varicose vein. 2 clamps are placed to control the bleeding and the area is packed the area is packed with moistened laps. Vascular surgeon is called in.a separate op note has been dictated by Dr. Doren Custard who placed some hemoclips and repaired a all venous defect until hemostasis was deemed adequate.  A few figure-of-eight stitches of 0 Vicryl are placed in the plane of dissection between the vaginal canal and the bladder to achieve hemostasis. We irrigate profusely  with warm saline and are satisfied with hemostasis. Hemostatic powder is deposited in the area of the right pelvic wall. A JP drain is placed through a right small incision and positioned at the level of the repaired cystostomy. It is sutured to the skin with 0 silk.  All sponges and retractors are removed. Under fascia hemostasis is completed with cauterization. The fascia is closed with interrupted Smead Jones sutures of 0 Vicryl.The wound is irrigated with warm saline and hemostasis is completed with cauterization. The subcuticular area is closed with interrupted sutures of 0 plain.The skin is closed with a subcuticular suture of 3-0 Monocryl and Steri-Strips.  Instruments and sponge count is complete 2.   Estimated blood loss during the procedure is at 4500 cc which initiated the massive transfusion protocol:  The patient  received 7 units of packed red blood cells, 3 units of plasma liquid, one unit of fresh frozen plasma, 500 mL of albumin, 5 units of cryoprecipitate, 12 units of calcium gluconate, 3500 cc of LR in 3400 cc of normal saline.  Her urine output  throughout the procedure was 500 cc. Patient's vital signs remained stable throughout the whole procedure.  Due to the complexity of this procedure as well as the massive blood loss the patient will be transferred to the Medical ICU by [facility]. Her care has been accepted by Dr. Nelda Marseille n critical care.  Specimen: Uterus, tubes and retroperitoneal fibroids weighed 3775 g and sent to pathology.  Patient Care Team:  Dr Karsten Ro, urology Dr Scot Dock, vascular surgeon Dr Christie Nottingham, [place]

## 2017-11-12 NOTE — Anesthesia Postprocedure Evaluation (Signed)
Anesthesia Post Note  Patient: Kathleen Cole  Procedure(s) Performed: HYSTERECTOMY ABDOMINAL WITH BILATERAL SALPINGECTOMY WITH REPAIR OF RETROPERIOTONEAL VENOUS BLEED (REPAIRED BY DR. Harrell Gave DICKSON FROM VASCULAR)( IN AT 11:25 OUT 11:50) (Bilateral Abdomen) URETERAL CATHETER  PLACEMENT BILATERAL, CLOSURE CYSTOTOMY (Bilateral Ureter)     Patient location during evaluation: Other Anesthesia Type: General Level of consciousness: patient remains intubated per anesthesia plan Pain management: pain level controlled Vital Signs Assessment: post-procedure vital signs reviewed and stable Respiratory status: patient on ventilator - see flowsheet for VS Cardiovascular status: blood pressure returned to baseline and stable Postop Assessment: no apparent nausea or vomiting Anesthetic complications: no Comments: Patient transferred to [facility] intubated, vital signs stable.    Last Vitals:  Vitals:   11/12/17 1432 11/12/17 1500  BP:    Pulse: 95 98  Resp: (!) 22 (!) 22  Temp:    SpO2: 100% 100%    Last Pain:  Vitals:   11/12/17 0611  TempSrc: Oral  PainSc: 0-No pain   Pain Goal: Patients Stated Pain Goal: 3 (11/12/17 8832)               Cyril Railey A.

## 2017-11-12 NOTE — Progress Notes (Signed)
Patient ID: Kathleen Cole, female   DOB: 02-Oct-1975, 42 y.o.   MRN: [id number] Patient remains intubated and sedated.  Vital signs are currently stable with a soft blood pressure  Abdominal dressing is dry and intact.  Foley catheter is indwelling and draining green-tinged urine with no clots.  Impression/plan: Status post closure of cystotomy - Her urine is clear.  She currently has a drain in as well.  Will monitor while in the hospital.  Her Foley catheter will remain indwelling for 2 weeks.  She will then follow-up with me as an outpatient for cystogram and removal of her catheter.

## 2017-11-12 NOTE — OR Nursing (Signed)
[place] ARRIVED AT [id number] TO TRANSFER PT TO MICU AT [place]

## 2017-11-12 NOTE — Anesthesia Procedure Notes (Signed)
Procedure Name: Intubation Date/Time: 11/12/2017 7:46 AM Performed by: Hewitt Blade, CRNA Pre-anesthesia Checklist: Patient identified, Emergency Drugs available, Suction available and Patient being monitored Patient Re-evaluated:Patient Re-evaluated prior to induction Oxygen Delivery Method: Circle system utilized Preoxygenation: Pre-oxygenation with 100% oxygen Induction Type: IV induction Ventilation: Mask ventilation without difficulty Laryngoscope Size: Mac and 3 Grade View: Grade I Tube type: Oral Tube size: 7.0 mm Number of attempts: 1 Airway Equipment and Method: Stylet Placement Confirmation: ETT inserted through vocal cords under direct vision,  positive ETCO2 and breath sounds checked- equal and bilateral Secured at: 22 cm Tube secured with: Tape Dental Injury: Teeth and Oropharynx as per pre-operative assessment

## 2017-11-12 NOTE — Consult Note (Addendum)
PULMONARY / CRITICAL CARE MEDICINE   Name: Kathleen Cole MRN: [id number] DOB: 11/07/1975    ADMISSION DATE:  11/12/2017 CONSULTATION DATE:  6/27  REFERRING MD:  Dr. Cletis Media GYN  CHIEF COMPLAINT:  Post-op ICU needs.   HISTORY OF PRESENT ILLNESS:   42 year old female with PMH as below, including very large painful uterine fibroids and menorrhagia. She presented for elective total hysterectomy and bilateral salpingectomy on 6/27. Surgical course complicated by cystostomy and hemorrhage from varicose veins in the pelvis. Urology and vascular surgery were consulted intra-operatively and these issues were repaired. EBL 456mL. She was transfused 6units of PRBC, 3 unit FFP, 1 unit platelets, and cryoprecipitate.  Post operatively she remained on the mechanical ventilatory and was transferred to [facility] MICU for ICU evaluation. PCCM consulted.   PAST MEDICAL HISTORY :  She  has a past medical history of Anemia, Asthma, Seasonal allergies, and SVD (spontaneous vaginal delivery).  PAST SURGICAL HISTORY: She  has a past surgical history that includes Wisdom tooth extraction and Tooth extraction.  Allergies  Allergen Reactions  . Shellfish Allergy Hives and Itching    No current facility-administered medications on file prior to encounter.    Current Outpatient Medications on File Prior to Encounter  Medication Sig  . acetaminophen (TYLENOL) 500 MG tablet Take 500 mg by mouth every 6 (six) hours as needed for mild pain or headache.  . albuterol (PROVENTIL HFA;VENTOLIN HFA) 108 (90 Base) MCG/ACT inhaler Inhale 1-2 puffs into the lungs every 6 (six) hours as needed for wheezing or shortness of breath.    FAMILY HISTORY:  Her has no family status information on file.    SOCIAL HISTORY: She  reports that she has never smoked. She has never used smokeless tobacco. She reports that she drinks alcohol. She reports that she does not use drugs.  REVIEW OF SYSTEMS:   Unable  SUBJECTIVE:     VITAL SIGNS: BP (!) 155/83   Pulse 64   Temp 98 F (36.7 C) (Oral)   Resp 16   LMP 10/31/2017 (Approximate)   SpO2 100%   HEMODYNAMICS:    VENTILATOR SETTINGS:    INTAKE / OUTPUT: No intake/output data recorded.  PHYSICAL EXAMINATION: General:  Overweight middle aged black female. Sedated Neuro:  Sedated, RASS -3 HEENT:  [place]/AT, PERRL, no JVD Cardiovascular:  RRR, no MRG Lungs:  Clear bilateral breath sounds Abdomen:  Surgical dressing in place.  Musculoskeletal:  No acute deformity Skin:  Grossly intact with the exception of surgical wounds.   LABS:  BMET Recent Labs  Lab 11/06/17 1130 11/12/17 0953 11/12/17 1037 11/12/17 1110  NA 138 141 142 136  K 3.9 4.0 4.0 4.0  CL 107  --   --  113*  CO2 22  --   --  20*  BUN 13  --   --  8  CREATININE 0.83  --   --  0.81  GLUCOSE 84  --   --  185*    Electrolytes Recent Labs  Lab 11/06/17 1130 11/12/17 1110  CALCIUM 8.8* 6.2*    CBC Recent Labs  Lab 11/06/17 1130 11/12/17 0953 11/12/17 1037 11/12/17 1110  WBC 5.0  --   --  12.4*  HGB 10.9* 7.8* 6.5* 9.9*  HCT 33.7* 23.0* 19.0* 29.5*  PLT 234  --   --  107*  107*    Coag's Recent Labs  Lab 11/12/17 1110  APTT 35  INR 1.66    Sepsis Markers No  results for input(s): LATICACIDVEN, PROCALCITON, O2SATVEN in the last 168 hours.  ABG No results for input(s): PHART, PCO2ART, PO2ART in the last 168 hours.  Liver Enzymes Recent Labs  Lab 11/12/17 1110  AST 13*  ALT 8  ALKPHOS 27*  BILITOT 0.6  ALBUMIN 2.1*    Cardiac Enzymes No results for input(s): TROPONINI, PROBNP in the last 168 hours.  Glucose No results for input(s): GLUCAP in the last 168 hours.  Imaging No results found.   STUDIES:    CULTURES:   ANTIBIOTICS: Cefotetan 6/27 periop  SIGNIFICANT EVENTS: 6/27 hysterectomy complicated by cystostomy and hemorrhage. ICU transfer on vent.   LINES/TUBES: ETT 6/27 >  DISCUSSION: 42 year old female who underwent  elective hysterectomy on 1/61 complicated by cystostomy and hemorrhage requiring massive transfusion protocol. Remained on ventilator post operatively. Transferred to ICU.   ASSESSMENT / PLAN:  PULMONARY A: Acute respiratory failure in the post-operative setting Asthma without acute exacerbation At risk TRALI/TACO  P:   Full vent support CXR ABG VAP bundle PRN bronchodilators  CARDIOVASCULAR A:  Hemorrhagic shock > improved. Off pressors s/p transfusion.   P:  Telemetry monitoring in ICU setting Phenylephrine PRN to keep MAP > 71mmHg EKG Troponin  RENAL A:   Hypocalcemia in the setting of massive transfusion  P:   Ca gluconate given at [facility] Repeat CMP KVO IVF  GASTROINTESTINAL/ GENITOURINARY  A:   S/p hysterectomy and bilateral salpingectomy Iatrogenic cystostomy (repaired by Dr. Karsten Ro)  P:   Management per GYN/Urology NPO Pepcid for SUP  HEMATOLOGIC A:   Acute blood loss anemia Intraabdominal hemorrhage intraoperatively (repaired by Dr. Scot Dock)  P:  Transfused PRBC and FFP at [facility] hospital Transfuse 2 units platelets  Calcium as above.   INFECTIOUS A:   Periop antibiotics  P:   As above  ENDOCRINE A:   No acute issues   P:   Monitor glucose  NEUROLOGIC A:   Acute metabolic encephalopathy in the setting of shock. Medical sedation contributing as well.  P:   RASS goal: -1 to -2 Propofol infusion Fentanyl infusion Daily [facility]  FAMILY  - Updates:   - Inter-disciplinary family meet or Palliative Care meeting due by:  7/3   Georgann Housekeeper, AGACNP-BC [place] [facility]/Critical Care Pager [phone] or [phone]  11/12/2017 2:33 PM  Attending Note:  42 year old female presenting for an elective hysterectomy that had multiple complications due to adhesions that were all addressed in the OR.  However, since the patient required massive transfusions.  The patient was left intubated and PCCM was consulted for vent and medical  management with the operating surgeon as the admitting physician.  On exam, patient is sedated and paralyzed.  Clear lungs.  I reviewed ABG and vent adjusted for ABG.  CXR ordered and pending.  Abx per primary.  F/U on cultures.  Monitor closely for TRALI.  Adjust vent for ABG.  CXR and ABG in AM.  IVF hydration.  Sedation as ordered.  PCCM will follow for vent and medical management.  The patient is critically ill with multiple organ systems failure and requires high complexity decision making for assessment and support, frequent evaluation and titration of therapies, application of advanced monitoring technologies and extensive interpretation of multiple databases.   Critical Care Time devoted to patient care services described in this note is  35  Minutes. This time reflects time of care of this signee Dr Jennet Maduro. This critical care time does not reflect procedure time, or teaching  time or supervisory time of PA/NP/Med student/Med Resident etc but could involve care discussion time.  Rush Farmer, M.D. [facility] Pulmonary/Critical Care Medicine. Pager: [phone]. After hours pager: [phone].

## 2017-11-12 NOTE — Progress Notes (Signed)
Patient is sedated on Propofol drip with endotracheal tube VS are stable without pressors Urine output is adequate. Indwelling Foley catheter: greenish urine (patient received IV Methylene blue during surgery) JP drain positioned in anterior pelvis at the level of the vaginal cuff/ repaired cystostomy: drained 90 cc serosanguinous since leaving the OR  Hgb 10.1 WBC 10.9 Platelets 67: received 2 units of platelets  Abdomen is soft with dry pressure dressing  Family updated on all operative findings complicated by massive blood loss and incidental cystostomy Questions answered Patient will remain sedated through the night with expected extubation in am  Care transferred to Dr Everett Graff and Dr Sanjuana Kava through the weekend: [phone] option #4  Delsa Bern MD

## 2017-11-12 NOTE — Anesthesia Procedure Notes (Signed)
Arterial Line Insertion Start/End6/27/2019 11:40 AM, 11/12/2017 11:42 AM Performed by: Josephine Igo, MD, anesthesiologist  Patient location: OR. Preanesthetic checklist: patient identified, IV checked, site marked, risks and benefits discussed, surgical consent, monitors and equipment checked, pre-op evaluation, timeout performed and anesthesia consent Emergency situation Right, radial was placed Catheter size: 20 G Hand hygiene performed  and maximum sterile barriers used  Allen's test indicative of satisfactory collateral circulation Attempts: 1 Procedure performed without using ultrasound guided technique. Following insertion, dressing applied and Biopatch. Patient tolerated the procedure well with no immediate complications.

## 2017-11-12 NOTE — Progress Notes (Addendum)
PCCM INTERVAL PROGRESS NOTE  PM lab follow up:  K 3.9, phos 1.9, mag 1.4, Ca corrects to 8.3 Hemoglobin 10, Platelet 67  Will replace K, mag, phos, and Ca  Georgann Housekeeper, AGACNP-BC [place] [facility]/Critical Care Pager [phone] or [phone]  11/12/2017 6:48 PM

## 2017-11-12 NOTE — Op Note (Signed)
    NAME: Kathleen Cole    MRN: [id number] DOB: Oct 01, 1975    DATE OF OPERATION: 11/12/2017  PREOP DIAGNOSIS:    Intraoperative bleeding  POSTOP DIAGNOSIS:    Intraoperative bleeding  PROCEDURE:    Exploration for intraoperative bleeding in pelvis.  SURGEON: Judeth Cornfield. Scot Dock, MD, FACS  ASSIST: Dr. Cletis Media  ANESTHESIA: General  EBL: Per anesthesia record  INDICATIONS:    [name] is a 42 y.o. female who presented for removal of a large leiomyoma.  The surgeon encountered significant bleeding intraoperatively and vascular surgery was consulted.   FINDINGS:   The majority of the bleeding that I could identify was related to some dilated veins in the pelvis with which were clipped or oversewn with 5-0 Prolene.  TECHNIQUE:   When I arrived, the abdomen was opened and there were multiple clamps in place.  The bleeding appeared to be under control. The patient had been aggressively transfused by anesthesia.  Clotting parameters were being checked.  After exposing the area of concern carefully I selectively removed each clamp.  The initial clamp was around some peritoneum with underlying smaller veins and this was ligated with a 2-0 silk tie and the clamp removed.  An additional clamp was removed without any significant bleeding noted.  The final clamp was removed and there was bleeding from some varicose veins in the pelvis.  Several of these were clipped for hemostasis.  There was some bleeding deeper down in the pelvis and this was sutured with a 5-0 Prolene.  The wound was then irrigated.  Additional hemostasis was obtained using electrocautery.  At this point there was no significant bleeding.  I did apply a topical hemostatic agent.  I felt that if there was significant arterial injury that we would have discovered this at this point.  Likewise, if there was a significant venous injury I would have expected this to bleed as we explored this area and retracted in  multiple directions.  Therefore at this point I thought it was safe to close the abdomen.  Coagulation parameters were being checked.  The patient should be kept fairly immobile  initially postoperatively.   Deitra Mayo, MD, FACS [facility]  DATE OF DICTATION:   11/12/2017

## 2017-11-12 NOTE — Op Note (Signed)
PATIENT:  Kathleen Cole  PRE-OPERATIVE DIAGNOSIS: 1.Cystotomy 2. Possible ureteral injury  POST-OPERATIVE DIAGNOSIS:  Cystotomy  PROCEDURE:  1. Bilateral ureteral catheterization.   2. Closure of cystotomy  SURGEON:  Claybon Jabs  INDICATION: [name] is a  42 year old female patient of Dr. Cletis Media seen in the operating room as an intraoperative consultation for what was found to be a cystotomy that occurred during the removal of a very large leiomyoma.  During the procedure it was noted the right ureter coursed anterior to the mass and required dissection off of the mass.  It was identified and appeared to be spared however I was consulted in order to determine the integrity of the ureters on both sides as the left ureter had not been identified intraoperatively.  In addition there was a transverse cystotomy involving the dome of the bladder requiring closure.   ANESTHESIA:  General  EBL:  Minimal during my portion of the operation  DRAINS:  46 French Foley catheter in the  Bladder and a Blake drain in the pelvis  Description of procedure: I scrubbed in to surgery and evaluated the bladder.  There was a cystotomy running along the top of the bladder approximately 10 cm in width.  This allowed full visualization of the interior of the bladder.  There were no lesions noted.  A Foley catheter was in place and was removed to allow better visualization of the trigone.  Stay sutures were placed using 2 0 chromic in the right and left sides of the cystotomy and I was then able to visualize the trigonal region.  I noted brisk efflux from the right ureter.  It was clear.  I therefore 1st cannulated the right ureter with a 0.038 in sensor guidewire and passed this up the ureter by palpating the ureter and had helping guide the guidewire up into the area of the renal pelvis.  I then passed a 5 [place] open-ended ureteral catheter over the guidewire into the area the renal pelvis.  The guidewire  was removed and through the abdominal incision I was able to visualize the ureter.  It appeared intact throughout.  It was somewhat thinned but was completely intact throughout its length.  The open-ended catheter was left in place to help identify the location of the contralateral ureteral orifice.    The left ureteral orifice was identified.  I then proceeded in identical fashion by 1st passing a guidewire up the left ureter.  This passed easily.  I then passed the open-ended catheter over the guidewire and noted that the ureter remained in its normal anatomic position and had not been exposed.  It was covered with normal appearing peritoneum down to several cm from the bladder.  This area he was visualized and palpated and I noted no evidence of ureteral injury.  I therefore removed both the right and left ureteral catheters.    I then proceeded to close the cystotomy.  This was well away from the ureteral   Right and left ureteral orifice.  I closed the mucosa and muscularis with a running, 2 0 chromic suture.  I then closed the muscularis and serosa with a 2nd layer of 2 0 chromic in a locking fashion.  Access to the urethra was not available intraoperatively and therefore  An 34 French Foley catheter was to be placed at the end of the operation.  In addition a Blake drain was to be placed in the pelvis at the end of the operation  as well by Dr. Cletis Media .  The patient tolerated this portion of the procedure with no intraoperative complications and was well tolerated.  She remains stable.  The remainder of her operation will be dictated separately.

## 2017-11-12 NOTE — OR Nursing (Signed)
PT TRANSFERRED TO [place] ICU BY CARELINK AT 1400

## 2017-11-12 NOTE — OR Nursing (Signed)
DR. Karsten Ro CALLED IN FOR URETERAL CATHETER PLACEMENT AND CYSTOTOMY REPAIR. ARRIVED 10:30 DEPART 11:15. DR. Harrell Gave DICKSON CALLED IN FOR REPAIR RETROPERITONEAL VENOUS BLEED. ARRIVED 11:25 DEPART 11:50. MTP CALLED AT 11:03 BY ANESTHESIA.  TRIED MULTIPLE TIMES TO REACH FAMILY IN SURGICAL WAITING ROOM FOR UPDATE AT DR RIVARD'S REQUEST. UNABLE TO REACH FAMILY OR UPDATE FAMILY

## 2017-11-12 NOTE — Interval H&P Note (Signed)
History and Physical Interval Note:  11/12/2017 7:26 AM  Kathleen Cole  has presented today for surgery, with the diagnosis of Large Symptomatic  The various methods of treatment have been discussed with the patient and family. After consideration of risks, benefits and other options for treatment, the patient has consented to  Procedure(s): HYSTERECTOMY ABDOMINAL (Bilateral) with BILATERAL SALPYNGECTOMY as a surgical intervention .  The patient's history has been reviewed, patient examined, no change in status, stable for surgery.  I have reviewed the patient's chart and labs.  Questions were answered to the patient's satisfaction.     Kathleen Cole

## 2017-11-12 NOTE — Transfer of Care (Signed)
Immediate Anesthesia Transfer of Care Note  Patient: Kathleen Cole  Procedure(s) Performed: HYSTERECTOMY ABDOMINAL WITH BILATERAL SALPINGECTOMY WITH REPAIR OF RETROPERIOTONEAL VENOUS BLEED (REPAIRED BY DR. Harrell Gave DICKSON FROM VASCULAR)( IN AT 11:25 OUT 11:50) (Bilateral Abdomen) URETERAL CATHETER  PLACEMENT BILATERAL, CLOSURE CYSTOTOMY (Bilateral Ureter)  Patient Location: ICU  Anesthesia Type:General  Level of Consciousness: sedated  Airway & Oxygen Therapy: Patient remains intubated per anesthesia plan  Post-op Assessment: Report given to RN  Post vital signs: Reviewed and stable  Last Vitals:  Vitals Value Taken Time  BP    Temp    Pulse    Resp    SpO2      Last Pain:  Vitals:   11/12/17 0611  TempSrc: Oral  PainSc: 0-No pain      Patients Stated Pain Goal: 3 (65/03/54 6568)  Complications: No apparent anesthesia complications

## 2017-11-13 ENCOUNTER — Encounter (HOSPITAL_COMMUNITY): Payer: Self-pay | Admitting: Obstetrics and Gynecology

## 2017-11-13 DIAGNOSIS — D62 Acute posthemorrhagic anemia: Secondary | ICD-10-CM

## 2017-11-13 DIAGNOSIS — R578 Other shock: Secondary | ICD-10-CM

## 2017-11-13 DIAGNOSIS — J9601 Acute respiratory failure with hypoxia: Secondary | ICD-10-CM

## 2017-11-13 LAB — PREPARE FRESH FROZEN PLASMA
UNIT DIVISION: 0
UNIT DIVISION: 0
Unit division: 0
Unit division: 0

## 2017-11-13 LAB — CBC
HCT: 23.3 % — ABNORMAL LOW (ref 36.0–46.0)
HCT: 25.3 % — ABNORMAL LOW (ref 36.0–46.0)
HEMATOCRIT: 24.4 % — AB (ref 36.0–46.0)
HEMOGLOBIN: 8.3 g/dL — AB (ref 12.0–15.0)
HEMOGLOBIN: 8.6 g/dL — AB (ref 12.0–15.0)
Hemoglobin: 7.9 g/dL — ABNORMAL LOW (ref 12.0–15.0)
MCH: 28.1 pg (ref 26.0–34.0)
MCH: 28.5 pg (ref 26.0–34.0)
MCH: 28.9 pg (ref 26.0–34.0)
MCHC: 33.9 g/dL (ref 30.0–36.0)
MCHC: 34 g/dL (ref 30.0–36.0)
MCHC: 34 g/dL (ref 30.0–36.0)
MCV: 82.9 fL (ref 78.0–100.0)
MCV: 83.8 fL (ref 78.0–100.0)
MCV: 84.9 fL (ref 78.0–100.0)
PLATELETS: 71 10*3/uL — AB (ref 150–400)
Platelets: 63 10*3/uL — ABNORMAL LOW (ref 150–400)
Platelets: 77 10*3/uL — ABNORMAL LOW (ref 150–400)
RBC: 2.81 MIL/uL — AB (ref 3.87–5.11)
RBC: 2.91 MIL/uL — ABNORMAL LOW (ref 3.87–5.11)
RBC: 2.98 MIL/uL — ABNORMAL LOW (ref 3.87–5.11)
RDW: 15.4 % (ref 11.5–15.5)
RDW: 15.1 % (ref 11.5–15.5)
RDW: 15.3 % (ref 11.5–15.5)
WBC: 10.8 10*3/uL — AB (ref 4.0–10.5)
WBC: 11.4 10*3/uL — ABNORMAL HIGH (ref 4.0–10.5)
WBC: 11.9 10*3/uL — AB (ref 4.0–10.5)

## 2017-11-13 LAB — BASIC METABOLIC PANEL
Anion gap: 6 (ref 5–15)
BUN: 11 mg/dL (ref 6–20)
CALCIUM: 7 mg/dL — AB (ref 8.9–10.3)
CHLORIDE: 113 mmol/L — AB (ref 98–111)
CO2: 20 mmol/L — AB (ref 22–32)
CREATININE: 1.2 mg/dL — AB (ref 0.44–1.00)
GFR calc Af Amer: >60 mL/min (ref >60)
GFR calc non Af Amer: 55 mL/min — ABNORMAL LOW (ref >60)
Glucose, Bld: 118 mg/dL — ABNORMAL HIGH (ref 70–99)
Potassium: 3.5 mmol/L (ref 3.5–5.1)
SODIUM: 139 mmol/L (ref 135–145)

## 2017-11-13 LAB — BPAM FFP
BLOOD PRODUCT EXPIRATION DATE: 201906292359
BLOOD PRODUCT EXPIRATION DATE: 201907022359
Blood Product Expiration Date: 201907182359
Blood Product Expiration Date: 201907182359
ISSUE DATE / TIME: 201906271001
ISSUE DATE / TIME: 201906271001
ISSUE DATE / TIME: 201906271100
ISSUE DATE / TIME: 201906271156
UNIT TYPE AND RH: 6200
UNIT TYPE AND RH: 6200
UNIT TYPE AND RH: 5100
Unit Type and Rh: 6200

## 2017-11-13 LAB — POCT I-STAT 3, ART BLOOD GAS (G3+)
Acid-base deficit: 3 mmol/L — ABNORMAL HIGH (ref 0.0–2.0)
Bicarbonate: 20.3 mmol/L (ref 20.0–28.0)
O2 SAT: 100 %
PCO2 ART: 25.9 mmHg — AB (ref 32.0–48.0)
PO2 ART: 196 mmHg — AB (ref 83.0–108.0)
Patient temperature: 97.69
TCO2: 21 mmol/L — AB (ref 22–32)
pH, Arterial: 7.499 — ABNORMAL HIGH (ref 7.350–7.450)

## 2017-11-13 LAB — PROTIME-INR
INR: 1.44
Prothrombin Time: 17.4 seconds — ABNORMAL HIGH (ref 11.4–15.2)

## 2017-11-13 LAB — MAGNESIUM: MAGNESIUM: 1.9 mg/dL (ref 1.7–2.4)

## 2017-11-13 LAB — GLUCOSE, CAPILLARY
GLUCOSE-CAPILLARY: 105 mg/dL — AB (ref 70–99)
Glucose-Capillary: 123 mg/dL — ABNORMAL HIGH (ref 70–99)

## 2017-11-13 LAB — BPAM PLATELET PHERESIS
Blood Product Expiration Date: 201906272359
Unit Type and Rh: 6200

## 2017-11-13 LAB — PREPARE CRYOPRECIPITATE: UNIT DIVISION: 0

## 2017-11-13 LAB — PREPARE PLATELET PHERESIS: UNIT DIVISION: 0

## 2017-11-13 LAB — BPAM CRYOPRECIPITATE
BLOOD PRODUCT EXPIRATION DATE: 201906271822
ISSUE DATE / TIME: 201906271223
Unit Type and Rh: 5100

## 2017-11-13 LAB — PHOSPHORUS: Phosphorus: 3 mg/dL (ref 2.5–4.6)

## 2017-11-13 LAB — FIBRINOGEN: Fibrinogen: 296 mg/dL (ref 210–475)

## 2017-11-13 LAB — PREPARE RBC (CROSSMATCH)

## 2017-11-13 LAB — HIV ANTIBODY (ROUTINE TESTING W REFLEX): HIV Screen 4th Generation wRfx: NONREACTIVE

## 2017-11-13 MED ORDER — OXYCODONE HCL 5 MG PO TABS
5.0000 mg | ORAL_TABLET | Freq: Four times a day (QID) | ORAL | Status: DC | PRN
  Administered 2017-11-13: 5 mg via ORAL
  Filled 2017-11-13: qty 1

## 2017-11-13 MED ORDER — ORAL CARE MOUTH RINSE
15.0000 mL | Freq: Two times a day (BID) | OROMUCOSAL | Status: DC
  Administered 2017-11-13: 15 mL via OROMUCOSAL

## 2017-11-13 MED ORDER — SODIUM CHLORIDE 0.9% IV SOLUTION
Freq: Once | INTRAVENOUS | Status: AC
  Administered 2017-11-13: 06:00:00 via INTRAVENOUS

## 2017-11-13 MED ORDER — ACETAMINOPHEN 325 MG PO TABS: 650.0000 mg | ORAL_TABLET | Freq: Four times a day (QID) | ORAL | Status: DC | PRN

## 2017-11-13 MED ORDER — ALBUMIN HUMAN 5 % IV SOLN
25.0000 g | Freq: Once | INTRAVENOUS | Status: AC
  Administered 2017-11-13: 25 g via INTRAVENOUS
  Filled 2017-11-13: qty 250

## 2017-11-13 MED ORDER — OXYCODONE HCL 5 MG PO TABS
5.0000 mg | ORAL_TABLET | Freq: Four times a day (QID) | ORAL | Status: DC | PRN
  Administered 2017-11-13: 5 mg
  Filled 2017-11-13: qty 1

## 2017-11-13 NOTE — Progress Notes (Signed)
Patient c/o fullness in bladder.  Upon assessment, patient U/O was decreased.  Flushed foley with 20cc normal saline.  Patient voided 400cc maroon colored urine into foley bag. Patient stated she "felt better" afterwords. Georgann Housekeeper, NP made aware.  Will continue to monitor.

## 2017-11-13 NOTE — Progress Notes (Signed)
[facility] Progress Note Patient Name: BELA BONAPARTE DOB: 10/17/1975 MRN: [id number]   Date of Service  11/13/2017  HPI/Events of Note  Hypotension and acute blood loss anemia s/p complicated hysterectomy with marked intra-operative blood loss.  eICU Interventions  Transfuse one unit PRBC, while waiting on blood availability bolus 500 ml of 5 % Albumin x 1, check PT, INR. Fibrinogen, ionized calcium.        Kerry Kass Ogan 11/13/2017, 5:16 AM

## 2017-11-13 NOTE — Progress Notes (Signed)
Kathleen Cole is a36 y.o.  [id number]  Post Op Date # 1: TAH/BS/Repair of Cystotomy/Massive Blood Transfusion  Subjective: Patient is stable in MICU at [facility] on a ventilator. Patient has a low MAP and a decrease in Hgb causing her to receive an additional unit of PRBC and Albumin. Urine output is adequate and remains stained with blood and methylene blue.  Objective: Vital signs in last 24 hours: Temp:  [97.6 F (36.4 C)-99.6 F (37.6 C)] 99.6 F (37.6 C) (06/28 0412) Pulse Rate:  [70-98] 77 (06/28 0602) Resp:  [22-26] 26 (06/28 0602) BP: (80-96)/(46-75) 80/47 (06/28 0500) SpO2:  [100 %] 100 % (06/28 0602) Arterial Line BP: (97-121)/(39-59) 104/40 (06/28 0602) FiO2 (%):  [40 %] 40 % (06/28 0251) Weight:  [179 lb 14.3 oz (81.6 kg)-181 lb 7 oz (82.3 kg)] 179 lb 14.3 oz (81.6 kg) (06/28 0500)  Intake/Output from previous day: 06/27 0701 - 06/28 0700 In: 12341.7 [I.V.:7562.3] Out: 5974 [Urine:1995; Drains:163] Intake/Output this shift: Total I/O In: 986.4 [I.V.:355.1; IV Piggyback:631.3] Out: 618 [Urine:545; Drains:73] Recent Labs  Lab 11/12/17 1519 11/12/17 2053 11/13/17 0240  WBC 10.9* 10.5 10.8*  HGB 10.1* 9.1* 7.9*  HCT 30.2* 26.1* 23.3*  PLT 67* 69* 71*     Recent Labs  Lab 11/06/17 1130  11/12/17 1037 11/12/17 1110 11/12/17 1519  NA 138   < > 142 136 141  K 3.9   < > 4.0 4.0 3.9  CL 107  --   --  113* 113*  CO2 22  --   --  20* 20*  BUN 13  --   --  8 7  CREATININE 0.83  --   --  0.81 0.99  CALCIUM 8.8*  --   --  6.2* 7.3*  PROT  --   --   --  3.4* 4.6*  BILITOT  --   --   --  0.6 0.9  ALKPHOS  --   --   --  27* 28*  ALT  --   --   --  8 12  AST  --   --   --  13* 18  GLUCOSE 84  --   --  185* 148*   < > = values in this interval not displayed.    EXAM: General: intubated but arousable. Resp: clear to auscultation bilaterally and mechanical ventilation/ Cardio: RRR GI: Decreased bowel sounds and wound pressure dressing is clean, dry and  intact. Extremities: SCD hose in place and functioning; no calf tenderness.   Assessment: s/p Procedure(s): HYSTERECTOMY ABDOMINAL WITH BILATERAL SALPINGECTOMY WITH REPAIR OF RETROPERIOTONEAL VENOUS BLEED (REPAIRED BY DR. Harrell Gave DICKSON FROM VASCULAR)( IN AT 11:25 OUT 11:50) URETERAL CATHETER  PLACEMENT BILATERAL, CLOSURE CYSTOTOMY: stable and anemia  Plan: Per Critical Care Team  LOS: 1 day    Earnstine Regal, PA-C 11/13/2017 6:15 AM

## 2017-11-13 NOTE — Plan of Care (Signed)
  Problem: Elimination: Goal: Will not experience complications related to urinary retention Outcome: Progressing Note:  Foley in place postop, placed by urology, plan to remove foley at outpatient urology office in 2 weeks.   Problem: Pain Managment: Goal: General experience of comfort will improve Outcome: Progressing Note:  Sedated with fentanyl and propofol   Problem: Activity: Goal: Ability to tolerate increased activity will improve Outcome: Progressing   Problem: Respiratory: Goal: Ability to maintain a clear airway and adequate ventilation will improve Outcome: Progressing   Problem: Role Relationship: Goal: Method of communication will improve Outcome: Progressing

## 2017-11-13 NOTE — Procedures (Signed)
Extubation Procedure Note  Patient Details:   Name: Kathleen Cole DOB: 05-Apr-1976 MRN: [id number]   Airway Documentation:    Vent end date: 11/13/17 Vent end time: 1409   Evaluation  O2 sats: stable throughout Complications: No apparent complications Patient did tolerate procedure well. Bilateral Breath Sounds: Clear   Yes   Pt extubated to 4L N/C.  No stridor noted.  RN @ bedside.  Donnetta Hail 11/13/2017, 2:10 PM

## 2017-11-13 NOTE — Progress Notes (Signed)
PULMONARY / CRITICAL CARE MEDICINE   Name: Kathleen Cole MRN: [id number] DOB: 10/06/1975    ADMISSION DATE:  11/12/2017 CONSULTATION DATE:  6/27  REFERRING MD:  Dr. Cletis Media GYN  CHIEF COMPLAINT:  Post-op ICU needs.   HISTORY OF PRESENT ILLNESS:   42 year old female with PMH as below, including very large painful uterine fibroids and menorrhagia. She presented for elective total hysterectomy and bilateral salpingectomy on 6/27. Surgical course complicated by cystostomy and hemorrhage from varicose veins in the pelvis. Urology and vascular surgery were consulted intra-operatively and these issues were repaired. EBL 4547mL. She was transfused 6units of PRBC, 3 unit FFP, 1 unit platelets, and cryoprecipitate.  Post operatively she remained on the mechanical ventilatory and was transferred to [facility] MICU for ICU evaluation. PCCM consulted.   SUBJECTIVE: transfused one unit PRBC overnight for some hypotension. BP good this AM. Tolerating wean. Complains of some mild abdominal pain.    VITAL SIGNS: BP 104/65   Pulse 66   Temp 99.3 F (37.4 C) (Oral)   Resp 18   Ht 5' 4.02" (1.626 m)   Wt 81.6 kg (179 lb 14.3 oz)   LMP 10/31/2017 (Approximate)   SpO2 100%   BMI 30.86 kg/m   HEMODYNAMICS:    VENTILATOR SETTINGS: Vent Mode: PSV;CPAP FiO2 (%):  [40 %] 40 % Set Rate:  [22 bmp-26 bmp] 26 bmp Vt Set:  [430 mL] 430 mL PEEP:  [5 cmH20] 5 cmH20 Pressure Support:  [5 cmH20-10 cmH20] 5 cmH20 Plateau Pressure:  [15 cmH20-17 cmH20] 17 cmH20  INTAKE / OUTPUT: I/O last 3 completed shifts: In: 12662.5 [I.V.:7633.1; JGGEZ:6629; IV Piggyback:1531.4] Out: 4765 [YYTKP:5465; Drains:183; Blood:5000]  PHYSICAL EXAMINATION: General:  Overweight middle aged black female awake on a wean. Neuro:  On WUA. RASS 0. Oriented and following commands.  HEENT:  [place]/AT, PERRL, no JVD. Cardiovascular:  RRR, no MRG. Lungs:  Clear bilateral breath sounds. Abdomen:  Surgical dressing in place. JP draining  serosanguinous, dark amber urine in bladder.  Musculoskeletal:  No acute deformity or ROM limitation.  Skin:  Grossly intact with the exception of surgical wounds.   LABS:  BMET Recent Labs  Lab 11/12/17 1110 11/12/17 1519 11/13/17 0601  NA 136 141 139  K 4.0 3.9 3.5  CL 113* 113* 113*  CO2 20* 20* 20*  BUN 8 7 11   CREATININE 0.81 0.99 1.20*  GLUCOSE 185* 148* 118*    Electrolytes Recent Labs  Lab 11/12/17 1110 11/12/17 1519 11/13/17 0601  CALCIUM 6.2* 7.3* 7.0*  MG  --  1.4* 1.9  PHOS  --  1.9* 3.0    CBC Recent Labs  Lab 11/12/17 1519 11/12/17 2053 11/13/17 0240  WBC 10.9* 10.5 10.8*  HGB 10.1* 9.1* 7.9*  HCT 30.2* 26.1* 23.3*  PLT 67* 69* 71*    Coag's Recent Labs  Lab 11/12/17 1110 11/12/17 1519 11/13/17 0601  APTT 35  --   --   INR 1.66 1.25 1.44    Sepsis Markers Recent Labs  Lab 11/12/17 1519  PROCALCITON <0.10    ABG Recent Labs  Lab 11/12/17 1812 11/13/17 0345  PHART 7.444 7.499*  PCO2ART 28.9* 25.9*  PO2ART 210.0* 196.0*    Liver Enzymes Recent Labs  Lab 11/12/17 1110 11/12/17 1519  AST 13* 18  ALT 8 12  ALKPHOS 27* 28*  BILITOT 0.6 0.9  ALBUMIN 2.1* 2.7*    Cardiac Enzymes Recent Labs  Lab 11/12/17 1519  TROPONINI <0.03    Glucose Recent  Labs  Lab 11/12/17 1538 11/13/17 0328 11/13/17 0803  GLUCAP 161* 123* 105*    Imaging Dg Chest Port 1 View  Result Date: 11/12/2017 CLINICAL DATA:  Ett placed EXAM: PORTABLE CHEST 1 VIEW COMPARISON:  None. FINDINGS: An endotracheal tube is in place, tip approximately 1.7 centimeters above the carina. Heart size is normal. There are no focal consolidations or pleural effusions. No pulmonary edema. IMPRESSION: Two endotracheal tube in place, 1.7 centimeters above carina. Electronically Signed   By: Nolon Nations M.D.   On: 11/12/2017 15:52   Dg Abd Portable 1v  Result Date: 11/12/2017 CLINICAL DATA:  Evaluate OG tube EXAM: PORTABLE ABDOMEN - 1 VIEW COMPARISON:  None.  FINDINGS: The OG tube terminates in the distal stomach. There is some sort a catheter or drain in the pelvis. IMPRESSION: 1. The distal tip of the OG tube is in the distal stomach. 2. Some sort a catheter or drain is seen in the pelvis. Electronically Signed   By: Dorise Bullion III M.D   On: 11/12/2017 16:06    STUDIES:    CULTURES:   ANTIBIOTICS: Cefotetan 6/27 periop  SIGNIFICANT EVENTS: 6/27 hysterectomy complicated by cystostomy and hemorrhage. ICU transfer on vent.   LINES/TUBES: ETT 6/27 >  DISCUSSION: 42 year old female who underwent elective hysterectomy on 6/38 complicated by cystostomy and hemorrhage requiring massive transfusion protocol. Remained on ventilator post operatively. Transferred to ICU. 6/28 weaning well. BP stable. HGB stable  ASSESSMENT / PLAN:  PULMONARY A: Acute respiratory failure in the post-operative setting Asthma without acute exacerbation At risk TRALI/TACO  P:   SBT this AM VAP bundle PRN bronchodilators Hopeful for extubation today.   CARDIOVASCULAR A:  Hemorrhagic shock > improved. Off pressors s/p transfusion.   P:  Telemetry monitoring in ICU setting Monitor H&H closely.  Troponin  RENAL A:   Hypocalcemia in the setting of massive transfusion  P:   Repeat CMP KVO IVF  GASTROINTESTINAL/ GENITOURINARY  A:   S/p hysterectomy and bilateral salpingectomy Iatrogenic cystostomy (repaired by Dr. Karsten Ro)  P:   Management per GYN/Urology NPO Pepcid for SUP  HEMATOLOGIC A:   Acute blood loss anemia Intraabdominal hemorrhage intraoperatively (repaired by Dr. Scot Dock)  P:  Transfused PRBC and FFP at [facility] hospital Transfused one unit PRBC overnight for hypotension.   NEUROLOGIC A:   Acute metabolic encephalopathy in the setting of shock. Medical sedation contributing as well.  P:   RASS goal:0 to -1. Weaning sedation, hopeful for extubation.  FAMILY  - Updates: Patient and family updated.   -  Inter-disciplinary family meet or Palliative Care meeting due by:  7/3   Georgann Housekeeper, AGACNP-BC [facility] Pulmonology/Critical Care Pager [phone] or [phone]  11/13/2017 12:06 PM

## 2017-11-13 NOTE — Progress Notes (Signed)
Patient ID: Kathleen Cole, female   DOB: [birth year], 42 y.o.   MRN: [id number]    Assessment: Cystotomy repair: Her urine is light pink with no clots this morning.  Her hemoglobin has dropped slightly but her JP output is not excessive.  Her urine output remains good.  She does remain mildly hypotensive.  No tachycardia.   Plan:  Continue Foley catheter drainage with no new urologic recommendations.   Subjective: Patient remains intubated but is now arousable.  Objective: Vital signs in last 24 hours: Temp:  [97.6 F (36.4 C)-99.6 F (37.6 C)] 99.6 F (37.6 C) (06/28 0412) Pulse Rate:  [64-98] 85 (06/28 0500) Resp:  [16-26] 26 (06/28 0500) BP: (80-155)/(46-83) 80/47 (06/28 0500) SpO2:  [100 %] 100 % (06/28 0500) Arterial Line BP: (97-121)/(39-59) 97/40 (06/28 0500) FiO2 (%):  [40 %] 40 % (06/28 0251) Weight:  [81.6 kg (179 lb 14.3 oz)-82.3 kg (181 lb 7 oz)] 81.6 kg (179 lb 14.3 oz) (06/28 0500)A  Intake/Output from previous day: 06/27 0701 - 06/28 0700 In: 12341.7 [I.V.:7562.3; PIRJJ:8841; IV Piggyback:1281.4] Out: 6606 [TKZSW:1093; Drains:163; Blood:5000] Intake/Output this shift: Total I/O In: 986.4 [I.V.:355.1; IV Piggyback:631.3] Out: 618 [Urine:545; Drains:73]  Past Medical History:  Diagnosis Date  . Anemia   . Asthma    rarely uses inhaler -uses when patient gets sick  . Seasonal allergies   . SVD (spontaneous vaginal delivery)    x 2    Physical Exam:  General: Awake, alert and in no apparent distress Lungs: Normal respiratory effort, chest expands symmetrically.  Abdomen: With dry, intact dressing. Foley catheter draining light pink urine with no clots.  Lab Results: Recent Labs    11/12/17 1519 11/12/17 2053 11/13/17 0240  WBC 10.9* 10.5 10.8*  HGB 10.1* 9.1* 7.9*  HCT 30.2* 26.1* 23.3*   BMET Recent Labs    11/12/17 1110 11/12/17 1519  NA 136 141  K 4.0 3.9  CL 113* 113*  CO2 20* 20*  GLUCOSE 185* 148*  BUN 8 7  CREATININE 0.81  0.99  CALCIUM 6.2* 7.3*   No results for input(s): LABURIN in the last 72 hours. Results for orders placed or performed during the hospital encounter of 11/12/17  MRSA PCR Screening     Status: None   Collection Time: 11/12/17  3:10 PM  Result Value Ref Range Status   MRSA by PCR NEGATIVE NEGATIVE Final    Comment:        The GeneXpert MRSA Assay (FDA approved for NASAL specimens only), is one component of a comprehensive MRSA colonization surveillance program. It is not intended to diagnose MRSA infection nor to guide or monitor treatment for MRSA infections. Performed at [facility], [place] [address], [place], [place] [address]     Studies/Results: Dg Chest Port 1 View  Result Date: 11/12/2017 CLINICAL DATA:  Ett placed EXAM: PORTABLE CHEST 1 VIEW COMPARISON:  None. FINDINGS: An endotracheal tube is in place, tip approximately 1.7 centimeters above the carina. Heart size is normal. There are no focal consolidations or pleural effusions. No pulmonary edema. IMPRESSION: Two endotracheal tube in place, 1.7 centimeters above carina. Electronically Signed   By: Nolon Nations M.D.   On: 11/12/2017 15:52   Dg Abd Portable 1v  Result Date: 11/12/2017 CLINICAL DATA:  Evaluate OG tube EXAM: PORTABLE ABDOMEN - 1 VIEW COMPARISON:  None. FINDINGS: The OG tube terminates in the distal stomach. There is some sort a catheter or drain in the pelvis. IMPRESSION: 1. The  distal tip of the OG tube is in the distal stomach. 2. Some sort a catheter or drain is seen in the pelvis. Electronically Signed   By: Dorise Bullion III M.D   On: 11/12/2017 16:06      [name] C 11/13/2017, 5:56 AM

## 2017-11-14 DIAGNOSIS — D259 Leiomyoma of uterus, unspecified: Secondary | ICD-10-CM | POA: Diagnosis present

## 2017-11-14 LAB — TYPE AND SCREEN
ABO/RH(D): O POS
Antibody Screen: NEGATIVE
Unit division: 0

## 2017-11-14 LAB — COMPREHENSIVE METABOLIC PANEL
ALT: 8 U/L (ref 0–44)
ANION GAP: 8 (ref 5–15)
AST: 31 U/L (ref 15–41)
Albumin: 2.3 g/dL — ABNORMAL LOW (ref 3.5–5.0)
Alkaline Phosphatase: 42 U/L (ref 38–126)
BUN: 5 mg/dL — ABNORMAL LOW (ref 6–20)
CHLORIDE: 109 mmol/L (ref 98–111)
CO2: 20 mmol/L — AB (ref 22–32)
Calcium: 7.2 mg/dL — ABNORMAL LOW (ref 8.9–10.3)
Creatinine, Ser: 1.1 mg/dL — ABNORMAL HIGH (ref 0.44–1.00)
GFR calc Af Amer: >60 mL/min (ref >60)
GFR calc non Af Amer: >60 mL/min (ref >60)
Glucose, Bld: 93 mg/dL (ref 70–99)
Potassium: 3.7 mmol/L (ref 3.5–5.1)
SODIUM: 137 mmol/L (ref 135–145)
Total Bilirubin: 0.7 mg/dL (ref 0.3–1.2)
Total Protein: 4.5 g/dL — ABNORMAL LOW (ref 6.5–8.1)

## 2017-11-14 LAB — CBC
HCT: 24.5 % — ABNORMAL LOW (ref 36.0–46.0)
Hemoglobin: 8.5 g/dL — ABNORMAL LOW (ref 12.0–15.0)
MCH: 28.9 pg (ref 26.0–34.0)
MCHC: 34.7 g/dL (ref 30.0–36.0)
MCV: 83.3 fL (ref 78.0–100.0)
PLATELETS: 76 10*3/uL — AB (ref 150–400)
RBC: 2.94 MIL/uL — AB (ref 3.87–5.11)
RDW: 15.2 % (ref 11.5–15.5)
WBC: 13.8 10*3/uL — ABNORMAL HIGH (ref 4.0–10.5)

## 2017-11-14 LAB — PHOSPHORUS: PHOSPHORUS: 2 mg/dL — AB (ref 2.5–4.6)

## 2017-11-14 LAB — MAGNESIUM: Magnesium: 1.9 mg/dL (ref 1.7–2.4)

## 2017-11-14 LAB — BPAM RBC
BLOOD PRODUCT EXPIRATION DATE: 201907222359
ISSUE DATE / TIME: 201906280612
Unit Type and Rh: 5100

## 2017-11-14 LAB — CALCIUM, IONIZED: CALCIUM, IONIZED, SERUM: 4.4 mg/dL — AB (ref 4.5–5.6)

## 2017-11-14 MED ORDER — DIPHENHYDRAMINE HCL 12.5 MG/5ML PO ELIX: 12.5000 mg | ORAL_SOLUTION | Freq: Four times a day (QID) | ORAL | Status: DC | PRN

## 2017-11-14 MED ORDER — DIPHENHYDRAMINE HCL 50 MG/ML IJ SOLN: 12.5000 mg | Freq: Four times a day (QID) | INTRAMUSCULAR | Status: DC | PRN

## 2017-11-14 MED ORDER — METOCLOPRAMIDE HCL 5 MG/ML IJ SOLN: 10.0000 mg | Freq: Once | INTRAMUSCULAR | Status: DC | PRN

## 2017-11-14 MED ORDER — PHENAZOPYRIDINE HCL 200 MG PO TABS: Freq: Once | ORAL | Status: DC

## 2017-11-14 MED ORDER — HYDROMORPHONE HCL 1 MG/ML IJ SOLN: 0.2500 mg | INTRAMUSCULAR | Status: DC | PRN

## 2017-11-14 MED ORDER — ONDANSETRON HCL 4 MG/2ML IJ SOLN: 4.0000 mg | Freq: Four times a day (QID) | INTRAMUSCULAR | Status: DC | PRN

## 2017-11-14 MED ORDER — PHENAZOPYRIDINE HCL 100 MG PO TABS
100.0000 mg | ORAL_TABLET | Freq: Three times a day (TID) | ORAL | Status: AC
  Administered 2017-11-14 – 2017-11-15 (×3): 100 mg via ORAL
  Filled 2017-11-14 (×4): qty 1

## 2017-11-14 MED ORDER — ALBUTEROL SULFATE HFA 108 (90 BASE) MCG/ACT IN AERS: 1.0000 | INHALATION_SPRAY | Freq: Four times a day (QID) | RESPIRATORY_TRACT | Status: DC | PRN

## 2017-11-14 MED ORDER — DOCUSATE SODIUM 100 MG PO CAPS
100.0000 mg | ORAL_CAPSULE | Freq: Two times a day (BID) | ORAL | Status: DC
  Administered 2017-11-14 – 2017-11-16 (×6): 100 mg via ORAL
  Filled 2017-11-14 (×7): qty 1

## 2017-11-14 MED ORDER — SODIUM CHLORIDE 0.9% FLUSH: 9.0000 mL | INTRAVENOUS | Status: DC | PRN

## 2017-11-14 MED ORDER — HYDROMORPHONE 1 MG/ML IV SOLN
INTRAVENOUS | Status: DC
  Administered 2017-11-14: 03:00:00 via INTRAVENOUS
  Filled 2017-11-14: qty 25

## 2017-11-14 MED ORDER — ALBUTEROL SULFATE (2.5 MG/3ML) 0.083% IN NEBU: 2.5000 mg | INHALATION_SOLUTION | Freq: Four times a day (QID) | RESPIRATORY_TRACT | Status: DC | PRN

## 2017-11-14 MED ORDER — LACTATED RINGERS IV SOLN
INTRAVENOUS | Status: DC
  Administered 2017-11-14 – 2017-11-17 (×9): via INTRAVENOUS

## 2017-11-14 MED ORDER — SULFAMETHOXAZOLE-TRIMETHOPRIM 400-80 MG PO TABS: 1.0000 | ORAL_TABLET | Freq: Two times a day (BID) | ORAL | Status: DC

## 2017-11-14 MED ORDER — NALOXONE HCL 0.4 MG/ML IJ SOLN: 0.4000 mg | INTRAMUSCULAR | Status: DC | PRN

## 2017-11-14 MED ORDER — OXYCODONE-ACETAMINOPHEN 5-325 MG PO TABS
1.0000 | ORAL_TABLET | Freq: Four times a day (QID) | ORAL | Status: DC | PRN
  Administered 2017-11-14 – 2017-11-15 (×2): 1 via ORAL
  Administered 2017-11-15 – 2017-11-17 (×4): 2 via ORAL
  Filled 2017-11-14 (×3): qty 2
  Filled 2017-11-14: qty 1
  Filled 2017-11-14: qty 2
  Filled 2017-11-14: qty 1

## 2017-11-14 MED ORDER — ONDANSETRON HCL 4 MG PO TABS
4.0000 mg | ORAL_TABLET | Freq: Three times a day (TID) | ORAL | Status: DC | PRN
  Filled 2017-11-14: qty 1

## 2017-11-14 MED ORDER — LIDOCAINE HCL URETHRAL/MUCOSAL 2 % EX GEL
1.0000 "application " | Freq: Three times a day (TID) | CUTANEOUS | Status: DC | PRN
  Filled 2017-11-14: qty 5

## 2017-11-14 MED ORDER — MEPERIDINE HCL 25 MG/ML IJ SOLN: 6.2500 mg | INTRAMUSCULAR | Status: DC | PRN

## 2017-11-14 MED ORDER — FENTANYL CITRATE (PF) 100 MCG/2ML IJ SOLN
INTRAMUSCULAR | Status: AC
  Filled 2017-11-14: qty 2

## 2017-11-14 MED ORDER — SODIUM CHLORIDE 0.9 % IV SOLN
2.0000 g | Freq: Two times a day (BID) | INTRAVENOUS | Status: AC
  Administered 2017-11-14 (×2): 2 g via INTRAVENOUS
  Filled 2017-11-14 (×2): qty 2

## 2017-11-14 MED ORDER — SULFAMETHOXAZOLE-TRIMETHOPRIM 800-160 MG PO TABS
1.0000 | ORAL_TABLET | Freq: Two times a day (BID) | ORAL | Status: DC
  Administered 2017-11-14 – 2017-11-17 (×8): 1 via ORAL
  Filled 2017-11-14 (×8): qty 1

## 2017-11-14 MED ORDER — MIDAZOLAM HCL 2 MG/2ML IJ SOLN
INTRAMUSCULAR | Status: AC
  Filled 2017-11-14: qty 2

## 2017-11-14 MED ORDER — MENTHOL 3 MG MT LOZG: 1.0000 | LOZENGE | OROMUCOSAL | Status: DC | PRN

## 2017-11-14 NOTE — Progress Notes (Signed)
2 Days Post-Op Subjective: Patient having some bladder spasms. Catheter not draining well overnight. I requested 22 fr foley catheter placed follwing 2 phone calls from ICU nurse last pm that catheter not irrigating. This was not done.  Objective: Vital signs in last 24 hours: Temp:  [98.8 F (37.1 C)-100.6 F (38.1 C)] 98.9 F (37.2 C) (06/29 0430) Pulse Rate:  [63-113] 104 (06/29 0430) Resp:  [16-26] 21 (06/29 0430) BP: (91-128)/(51-80) 104/70 (06/29 0430) SpO2:  [95 %-100 %] 96 % (06/29 0430) Arterial Line BP: (116-175)/(46-75) 155/68 (06/28 1500) FiO2 (%):  [33 %-40 %] 33 % (06/29 0358)  Intake/Output from previous day: 06/28 0701 - 06/29 0700 In: 1286.3 [P.O.:100; I.V.:192.2; Blood:485; IV Piggyback:189.1] Out: 2588 [Urine:2555; Drains:33] Intake/Output this shift: Total I/O In: 465.2 [P.O.:100; I.V.:26.1; Other:300; IV Piggyback:39.1] Out: 6237 [Urine:1400; Drains:25]  Physical Exam:  Constitutional: Vital signs reviewed. WD WN in NAD   Eyes: PERRL, No scleral icterus.   Cardiovascular: RRR Pulmonary/Chest: Normal effort Abdominal: Soft. Dressings dry.   Lab Results: Recent Labs    11/13/17 0240 11/13/17 1243 11/13/17 2146  HGB 7.9* 8.3* 8.6*  HCT 23.3* 24.4* 25.3*   BMET Recent Labs    11/12/17 1519 11/13/17 0601  NA 141 139  K 3.9 3.5  CL 113* 113*  CO2 20* 20*  GLUCOSE 148* 118*  BUN 7 11  CREATININE 0.99 1.20*  CALCIUM 7.3* 7.0*   Recent Labs    11/12/17 1110 11/12/17 1519 11/13/17 0601  INR 1.66 1.25 1.44   No results for input(s): LABURIN in the last 72 hours. Results for orders placed or performed during the hospital encounter of 11/12/17  MRSA PCR Screening     Status: None   Collection Time: 11/12/17  3:10 PM  Result Value Ref Range Status   MRSA by PCR NEGATIVE NEGATIVE Final    Comment:        The GeneXpert MRSA Assay (FDA approved for NASAL specimens only), is one component of a comprehensive MRSA colonization surveillance  program. It is not intended to diagnose MRSA infection nor to guide or monitor treatment for MRSA infections. Performed at [facility], [place] [address] [place], [place] [address]     Studies/Results: Dg Chest Port 1 View  Result Date: 11/12/2017 CLINICAL DATA:  Ett placed EXAM: PORTABLE CHEST 1 VIEW COMPARISON:  None. FINDINGS: An endotracheal tube is in place, tip approximately 1.7 centimeters above the carina. Heart size is normal. There are no focal consolidations or pleural effusions. No pulmonary edema. IMPRESSION: Two endotracheal tube in place, 1.7 centimeters above carina. Electronically Signed   By: Nolon Nations M.D.   On: 11/12/2017 15:52   Dg Abd Portable 1v  Result Date: 11/12/2017 CLINICAL DATA:  Evaluate OG tube EXAM: PORTABLE ABDOMEN - 1 VIEW COMPARISON:  None. FINDINGS: The OG tube terminates in the distal stomach. There is some sort a catheter or drain in the pelvis. IMPRESSION: 1. The distal tip of the OG tube is in the distal stomach. 2. Some sort a catheter or drain is seen in the pelvis. Electronically Signed   By: Dorise Bullion III M.D   On: 11/12/2017 16:06   22 Fr catheter placed by me. Irrigation w/ 200 cc ns--no clots. New bag placed. Assessment/Plan:   Bladder injury s/p repair. Hematuria--now treated w/ larger catheter.  Will order frequent catheter irrigations   LOS: 2 days   [name] 11/14/2017, 7:00 AM

## 2017-11-14 NOTE — Progress Notes (Signed)
MD Progress Note  HD#3 POD# 2 TAH / Bilateral Salpingectomy / Cystotomy repair / Retroperitoneal vacular injury and                            repair / Hemorrhagic shock managed with massive transfusion protocol   Subjective: Patient seen and examined at bedside, she reports doing "much better".  Her pain is well controlled. She denies chest pain, shortness of breath. She denies nausea or vomiting. She is tolerating solid     Foods "a little this morning".  She has not been out of bed.   She reports passing flatus overnight.   Patient Active Problem List   Diagnosis Date Noted  . Fibroid, uterine 11/14/2017  . Hemorrhagic shock ([place]) 11/12/2017      Scheduled Meds: . docusate sodium  100 mg Oral BID  . fentaNYL      . HYDROmorphone   Intravenous Q4H  . midazolam      . sulfamethoxazole-trimethoprim  1 tablet Oral Q12H   Continuous Infusions: . sodium chloride Stopped (11/12/17 2124)  . cefoTEtan (CEFOTAN) 2 GM IVPB (Mini-Bag Plus) 2 g (11/14/17 0818)  . lactated ringers 125 mL/hr at 11/14/17 0815   PRN Meds:sodium chloride, acetaminophen, albuterol, diphenhydrAMINE **OR** diphenhydrAMINE, HYDROmorphone (DILAUDID) injection, menthol-cetylpyridinium, meperidine (DEMEROL) injection, metoCLOPramide, naloxone **AND** sodium chloride flush, ondansetron (ZOFRAN) IV, ondansetron, oxyCODONE, oxyCODONE-acetaminophen  Objective:  Vital signs in last 24 hours: Temp:  [98.8 F (37.1 C)-100.6 F (38.1 C)] 98.8 F (37.1 C) (06/29 0810) Pulse Rate:  [63-113] 97 (06/29 0810) Resp:  [16-26] 26 (06/29 0819) BP: (100-128)/(60-80) 100/60 (06/29 0810) SpO2:  [95 %-100 %] 98 % (06/29 0819) Arterial Line BP: (139-175)/(56-75) 155/68 (06/28 1500) FiO2 (%):  [33 %] 33 % (06/29 0358)  Intake/Output last 3 shifts: I/O last 3 completed shifts: In: 2593.5 [P.O.:100; I.V.:618.1; Blood:485; Other:320; IV Piggyback:1070.4] Out: 6222 [Urine:3540; Drains:126]  Physical Exam: General appearance: alert,  cooperative and no distress Abdomen: Soft, moderate distention, appropriate tenderness. Vertical midline incision                    With pressure bandage in place, no drainage noted Pelvic: deferred Extremities: extremities normal, atraumatic, no cyanosis or edema and Homans sign is negative, no sign of DVT Skin: Skin color, texture, turgor normal. No rashes or lesions  Assessment/Plan: 42 year old AAF POD#2 s/p TAH / Bilateral Salpingectomy / Cystotomy / vascular injury / Hemorrhage with severe anemia s/p massive transfusion protocol.   GU Urine output adequate, 22 Fr catheter placed this morning replacing 16Fr catheter Foley to be in place upon discharge and will be managed by Urology.  Appreciate Urology Care of patient Will encourage ambulation of patient today and tomorrow.  Pathology report for Uterus/fallopian tubes: leiomyoma, benign No signs of intraabdominal bleeding, patient hemodynamically stable now.  JP drain in place and output decreasing.  Will add pyridium to regimen for bladder spasms Will order prn topical lidocaine at level of urethra for patient comfort.   CV / HEME Hemoglobin current: 8.5 s/p transfusion 7 units pRBCs, 3U FFP, 3U Platelets, 1U Cryo Vitals within normal limits, hypotension resolving.  Thrombocytopenia improving Platelets currently 76  NEURO Pain well controlled currently Will discontinue PCA either this evening or in am as patient is starting to eat solid foods.   LINES Will keep JP and foley in place Will order discontinuation of 2 of the 3 IV lines, keeping one in place  RESP: Patient currently on nasal cannula O2 - will d/c once PCA is discontinued Encouraged patient to use incentive spirometer.   ID: Patient febrile at 01:20 to 100.6 T curr 98.8 - will continue to monitor closely Patient on prophylactic Antibiotics: IV Cefotetan and Bactrim  FLUIDS / ELECTROLYTES: Will continue to replete electrolytes per Internal Medicine Recs.   Will Saline lock IV fluids once Electrolytes are corrected and PCA d/c'd  DVT PROPHYLAXIS Continue SCDs while patient is lying in bed Ambulate to chair today Ambulate in hallway tomorrow as goal  [facility] Ob/Gyn covering MD x 24 hours Mobile: [phone] Tomorrow coverage: Dr. Everett Graff MD from [place]

## 2017-11-14 NOTE — Progress Notes (Signed)
This RN removed 1 of the 3 IVs patient has. Patient agreeable to leaving one IV saline locked in case the infusing IV infiltrates.

## 2017-11-14 NOTE — Progress Notes (Signed)
Patient arrived to the room via hospital bed with daughter accompanying her. Oriented to room, [employer] and needs addressed.

## 2017-11-14 NOTE — Progress Notes (Signed)
Patient ID: Kathleen Cole, female   DOB: 04-08-1976, 42 y.o.   MRN: [id number]                                                                PROGRESS NOTE                                                                                                                                                                                                             Patient Demographics:    Kathleen Cole, is a 42 y.o. female, DOB - [birth year], GEX:[id number]  Admit date - 11/12/2017   Admitting Physician Delsa Bern, MD  Outpatient Primary MD for the patient is Patient, No Pcp Per  LOS - 2  Outpatient Specialists  No chief complaint on file. hemorrhagic shock   Brief Narrative   42 y.o.   female P: 2-0-0-2 who presents for hysterectomy because of  very large symptomatic uterine fibroids and menorrhagia.  For over 10 years the patient has had enlarging fibroids and describes a menstrual flow lasting for 5 days.  During that flow she will change her pad every 1.5 hours and occasionally, more often,  due to clots .   Pelvic cramping,  rated 10/10 on a 10 point pain scale,  accompanies her period,  but will be relieved to 5/10 with Ibuprofen 800 mg.  She goes on to report urinary frequency due to the pressure of her fibroids, occasional inter-menstrual spotting but denies dyspareunia or constipation.  A  pelvic ultrasound , May 2018 revealed: anteverted uterus ( 28 cm from fundus to external os)  28.61 x 14.65 x 20.78 cm, pedunculated lower uterine quadrant fibroid-8.27 cm; left ovary-4.94 cm and right ovary-not seen.   An endometrial biopsy was not able to be performed on this patient secondary to uterine enlargement displacing her cervix out of view.  A CBC and CMET in January 2018 by her primary care physician was  within normal limits.   Hemoglobin at that time was 11.4.   A review of both medical and cervical management options were given to the patient however, she has decided to proceed with an  abdominal hysterectomy for definitive management.  TAH and bilateral salpingetomy by Delsa Bern 11/12/2017  Bilateral ureteral catheterizations and closure of cystostomy 11/12/2017 Donia Ast  Surgical  course complicted by heomrrhage from varicose veins in the pelvis.  Transfused 6 uints of PRBC and 3 units FFP and 1 unit platelets and cryopreceiptate.    Transferred to [facility] 11/12/2017 to PCCM care for hemorrhagic shock due to complications of fibroid surgery  Transfused 2units platelets 11/12/2017 by pccm Magnesium and potassium and phosphorus repleted 11/12/2017.   Transfused 1 unit prbc on 11/13/2017 Extubated on 11/13/2017  Transferred to [facility] 11/14/2017   Subjective:    Omer Monter today is tolerating regular diet, passing slight flatus.  Slight abdominal pain.  Seems to be getting better.   No headache, No chest pain, No abdominal pain - No Nausea, No new weakness tingling or numbness, No Cough - SOB.   Assessment  & Plan :    Active Problems:   Hemorrhagic shock ([place])   Fibroid, uterine   Fibroids s/p TAH BSO 11/12/2017 Katharine Look [place]) Bilateral ureteral catheterizations and closure of cystostomy 11/12/2017 Elta Gu[place]) JP drain in place Foley in place Appreciate gynecology and urology input  Hemorrhagic shock/ Thrombocytopenia S/p transfusion 6 units prbc (6/27) 3 units FFP (6/27) 1 unit platelet (6/27) 1 unit cryoprecipitate (6/27) 2 units platelet (6/27) 1 unit PRBC (6/28) Check cbc in am  Hypokalemia, Hypomagnesemia S/p repletion Check cmp in am, check magnesium in am  Hyperglycemia Check hga1c  Code Status :  FULL CODE  Family Communication  : w patient  Disposition Plan  : home  Barriers For Discharge :   Consults  :  Urology, gynecology  Procedures  :   Fibroids s/p TAH BSO 11/12/2017 Katharine Look [place]) Bilateral ureteral catheterizations and closure of cystostomy 11/12/2017 Donia Ast)   DVT Prophylaxis  :  SCDs   Lab Results    Component Value Date   PLT 77 (L) 11/13/2017    Antibiotics  :  Cefotetan 6/27=> Bactrim DS 6/29=>  Anti-infectives (From admission, onward)   Start     Dose/Rate Route Frequency Ordered Stop   11/14/17 1000  cefoTEtan (CEFOTAN) 2 g in sodium chloride 0.9 % 100 mL IVPB     2 g 200 mL/hr over 30 Minutes Intravenous Every 12 hours 11/14/17 0124 11/15/17 0959   11/14/17 1000  sulfamethoxazole-trimethoprim (BACTRIM,SEPTRA) 400-80 MG per tablet 1 tablet  Status:  Discontinued    Note to Pharmacy:  BLADDER INJURY/REPAIR INTRAOPERATIVELY   1 tablet Oral Every 12 hours 11/14/17 0124 11/14/17 0129   11/14/17 0130  sulfamethoxazole-trimethoprim (BACTRIM DS,SEPTRA DS) 800-160 MG per tablet 1 tablet     1 tablet Oral Every 12 hours 11/14/17 0124 11/20/17 2159   11/12/17 1230  cefoTEtan (CEFOTAN) 2 g in sodium chloride 0.9 % 100 mL IVPB  Status:  Discontinued     2 g 200 mL/hr over 30 Minutes Intravenous Every 12 hours 11/12/17 1221 11/14/17 0124   11/12/17 1000  cefoTEtan (CEFOTAN) 2 g in sodium chloride 0.9 % 100 mL IVPB  Status:  Discontinued     2 g 200 mL/hr over 30 Minutes Intravenous Every 12 hours 11/12/17 0953 11/12/17 1223   11/12/17 0601  sodium chloride 0.9 % with cefoTEtan (CEFOTAN) ADS Med    Note to Pharmacy:  Remer Macho, [facility]   : cabinet override      11/12/17 0601 11/12/17 0737   11/12/17 0600  cefoTEtan (CEFOTAN) 2 g in sodium chloride 0.9 % 100 mL IVPB     2 g 200 mL/hr over 30 Minutes Intravenous On call to O.R. 11/12/17 0011 11/12/17 1239  Objective:   Vitals:   11/14/17 0120 11/14/17 0254 11/14/17 0358 11/14/17 0430  BP: 121/78   104/70  Pulse: (!) 110   (!) 104  Resp: 20 18 (!) 24 (!) 21  Temp: (!) 100.6 F (38.1 C)   98.9 F (37.2 C)  TempSrc: Oral   Oral  SpO2: 98% 98% 95% 96%  Weight:      Height:        Wt Readings from Last 3 Encounters:  11/13/17 81.6 kg (179 lb 14.3 oz)  11/06/17 77.6 kg (171 lb)     Intake/Output Summary (Last 24 hours) at  11/14/2017 0757 Last data filed at 11/14/2017 0600 Gross per 24 hour  Intake 1267.82 ml  Output 2988 ml  Net -1720.18 ml     Physical Exam  Awake Alert, Oriented X 3, No new F.N deficits, Normal affect [place].AT,PERRAL Supple Neck,No JVD, No cervical lymphadenopathy appriciated.  Symmetrical Chest wall movement, Good air movement bilaterally, CTAB RRR,No Gallops,Rubs or new Murmurs, No Parasternal Heave +ve B.Sounds, Abd Soft, No tenderness, No organomegaly appriciated, No rebound - guarding or rigidity. No Cyanosis, Clubbing or edema, No new Rash or bruise   JP in place Foley in place     Data Review:    CBC Recent Labs  Lab 11/12/17 1519 11/12/17 2053 11/13/17 0240 11/13/17 1243 11/13/17 2146  WBC 10.9* 10.5 10.8* 11.4* 11.9*  HGB 10.1* 9.1* 7.9* 8.3* 8.6*  HCT 30.2* 26.1* 23.3* 24.4* 25.3*  PLT 67* 69* 71* 63* 77*  MCV 84.8 82.1 82.9 83.8 84.9  MCH 28.4 28.6 28.1 28.5 28.9  MCHC 33.4 34.9 33.9 34.0 34.0  RDW 15.3 15.2 15.4 15.1 15.3    Chemistries  Recent Labs  Lab 11/12/17 0953 11/12/17 1037 11/12/17 1110 11/12/17 1519 11/13/17 0601  NA 141 142 136 141 139  K 4.0 4.0 4.0 3.9 3.5  CL  --   --  113* 113* 113*  CO2  --   --  20* 20* 20*  GLUCOSE  --   --  185* 148* 118*  BUN  --   --  8 7 11   CREATININE  --   --  0.81 0.99 1.20*  CALCIUM  --   --  6.2* 7.3* 7.0*  MG  --   --   --  1.4* 1.9  AST  --   --  13* 18  --   ALT  --   --  8 12  --   ALKPHOS  --   --  27* 28*  --   BILITOT  --   --  0.6 0.9  --    ------------------------------------------------------------------------------------------------------------------ Recent Labs    11/12/17 1628  TRIG 55    No results found for: HGBA1C ------------------------------------------------------------------------------------------------------------------ No results for input(s): TSH, T4TOTAL, T3FREE, THYROIDAB in the last 72 hours.  Invalid input(s):  FREET3 ------------------------------------------------------------------------------------------------------------------ No results for input(s): VITAMINB12, FOLATE, FERRITIN, TIBC, IRON, RETICCTPCT in the last 72 hours.  Coagulation profile Recent Labs  Lab 11/12/17 1110 11/12/17 1519 11/13/17 0601  INR 1.66 1.25 1.44    Recent Labs    11/12/17 1110  DDIMER 1.53*    Cardiac Enzymes Recent Labs  Lab 11/12/17 1519  TROPONINI <0.03   ------------------------------------------------------------------------------------------------------------------ No results found for: BNP  Inpatient Medications  Scheduled Meds: . docusate sodium  100 mg Oral BID  . fentaNYL      . HYDROmorphone   Intravenous Q4H  . midazolam      . sulfamethoxazole-trimethoprim  1 tablet Oral Q12H   Continuous Infusions: . sodium chloride Stopped (11/12/17 2124)  . cefoTEtan (CEFOTAN) 2 GM IVPB (Mini-Bag Plus)    . lactated ringers 125 mL/hr at 11/14/17 0252   PRN Meds:.sodium chloride, acetaminophen, albuterol, diphenhydrAMINE **OR** diphenhydrAMINE, HYDROmorphone (DILAUDID) injection, menthol-cetylpyridinium, meperidine (DEMEROL) injection, metoCLOPramide, naloxone **AND** sodium chloride flush, ondansetron (ZOFRAN) IV, ondansetron, oxyCODONE, oxyCODONE-acetaminophen  Micro Results Recent Results (from the past 240 hour(s))  MRSA PCR Screening     Status: None   Collection Time: 11/12/17  3:10 PM  Result Value Ref Range Status   MRSA by PCR NEGATIVE NEGATIVE Final    Comment:        The GeneXpert MRSA Assay (FDA approved for NASAL specimens only), is one component of a comprehensive MRSA colonization surveillance program. It is not intended to diagnose MRSA infection nor to guide or monitor treatment for MRSA infections. Performed at [facility], [place] [address], [place], [place] [address]     Radiology Reports Dg Chest Port 1 View  Result Date: 11/12/2017 CLINICAL DATA:  Ett  placed EXAM: PORTABLE CHEST 1 VIEW COMPARISON:  None. FINDINGS: An endotracheal tube is in place, tip approximately 1.7 centimeters above the carina. Heart size is normal. There are no focal consolidations or pleural effusions. No pulmonary edema. IMPRESSION: Two endotracheal tube in place, 1.7 centimeters above carina. Electronically Signed   By: Nolon Nations M.D.   On: 11/12/2017 15:52   Dg Abd Portable 1v  Result Date: 11/12/2017 CLINICAL DATA:  Evaluate OG tube EXAM: PORTABLE ABDOMEN - 1 VIEW COMPARISON:  None. FINDINGS: The OG tube terminates in the distal stomach. There is some sort a catheter or drain in the pelvis. IMPRESSION: 1. The distal tip of the OG tube is in the distal stomach. 2. Some sort a catheter or drain is seen in the pelvis. Electronically Signed   By: Dorise Bullion III M.D   On: 11/12/2017 16:06    Time Spent in minutes  30   Jani Gravel M.D on 11/14/2017 at 7:57 AM  Between 7am to 7pm - Pager - [phone]    After 7pm go to [email or web address] - password [facility]  [facility] -  Office  [phone]

## 2017-11-14 NOTE — Progress Notes (Signed)
Sent request down lastnight for 22G foley catheter for Ms Runyon twice and didn't receive it until 0530. Prior to receiving catheter I personally emptied 600 cc bloody urine from foley. When the new foley arrived, obtained all supplies to change it and came in with second nurse but drainage bag had another 350 cc urine in it. When I flushed the foley prior it drained out with no signs of clots so decided not to change foley catheter. The nurse who gave me report last night expressed the difficulty they'd been having with the patient and I offered to put the foley in if needed. When she came up she said it had been draining much better since we spoke. When Dr Vernie Shanks came in he wanted to know why foley wasn't placed. I explained I told ICU nurse I would do it hadn't been done. Together, Dr. Vernie Shanks inserted foley and I assisted and was educated really well by him on how to properly flush a foley. Patient seems comfortable and drainage seems to be flowing well at this time. Handed off report to dayshift nurse and updated her on Q4 flushing.

## 2017-11-15 DIAGNOSIS — E43 Unspecified severe protein-calorie malnutrition: Secondary | ICD-10-CM

## 2017-11-15 LAB — COMPREHENSIVE METABOLIC PANEL
ALBUMIN: 2.1 g/dL — AB (ref 3.5–5.0)
ALK PHOS: 57 U/L (ref 38–126)
ALT: 15 U/L (ref 0–44)
AST: 31 U/L (ref 15–41)
Anion gap: 5 (ref 5–15)
BILIRUBIN TOTAL: 0.5 mg/dL (ref 0.3–1.2)
CALCIUM: 7.4 mg/dL — AB (ref 8.9–10.3)
CO2: 25 mmol/L (ref 22–32)
Chloride: 108 mmol/L (ref 98–111)
Creatinine, Ser: 1 mg/dL (ref 0.44–1.00)
GFR calc Af Amer: >60 mL/min (ref >60)
GFR calc non Af Amer: >60 mL/min (ref >60)
GLUCOSE: 105 mg/dL — AB (ref 70–99)
POTASSIUM: 3.5 mmol/L (ref 3.5–5.1)
Sodium: 138 mmol/L (ref 135–145)
TOTAL PROTEIN: 4.6 g/dL — AB (ref 6.5–8.1)

## 2017-11-15 LAB — CBC
HEMATOCRIT: 21.3 % — AB (ref 36.0–46.0)
Hemoglobin: 7.3 g/dL — ABNORMAL LOW (ref 12.0–15.0)
MCH: 28.9 pg (ref 26.0–34.0)
MCHC: 34.3 g/dL (ref 30.0–36.0)
MCV: 84.2 fL (ref 78.0–100.0)
Platelets: 95 10*3/uL — ABNORMAL LOW (ref 150–400)
RBC: 2.53 MIL/uL — AB (ref 3.87–5.11)
RDW: 15.3 % (ref 11.5–15.5)
WBC: 9.8 10*3/uL (ref 4.0–10.5)

## 2017-11-15 LAB — MAGNESIUM: Magnesium: 1.8 mg/dL (ref 1.7–2.4)

## 2017-11-15 MED ORDER — POTASSIUM & SODIUM PHOSPHATES 280-160-250 MG PO PACK
1.0000 | PACK | Freq: Three times a day (TID) | ORAL | Status: AC
  Administered 2017-11-15 – 2017-11-16 (×4): 1 via ORAL
  Filled 2017-11-15 (×4): qty 1

## 2017-11-15 MED ORDER — PRO-STAT SUGAR FREE PO LIQD
30.0000 mL | Freq: Two times a day (BID) | ORAL | Status: DC
  Administered 2017-11-15 – 2017-11-16 (×4): 30 mL via ORAL
  Filled 2017-11-15 (×5): qty 30

## 2017-11-15 MED ORDER — DIPHENHYDRAMINE-ZINC ACETATE 2-0.1 % EX CREA
TOPICAL_CREAM | Freq: Three times a day (TID) | CUTANEOUS | Status: DC | PRN
  Administered 2017-11-15: 21:00:00 via TOPICAL
  Filled 2017-11-15: qty 28

## 2017-11-15 NOTE — Plan of Care (Signed)
  Problem: Activity: Goal: Risk for activity intolerance will decrease Outcome: Progressing   Problem: Nutrition: Goal: Adequate nutrition will be maintained Outcome: Progressing   Problem: Pain Managment: Goal: General experience of comfort will improve Outcome: Progressing   

## 2017-11-15 NOTE — Progress Notes (Signed)
3 Days Post-Op Subjective: Patient reports bladder spasms--manageable  Objective: Vital signs in last 24 hours: Temp:  [98.7 F (37.1 C)-99.8 F (37.7 C)] 98.7 F (37.1 C) (06/30 0431) Pulse Rate:  [81-102] 81 (06/30 0431) Resp:  [16] 16 (06/30 0431) BP: (92-102)/(67-69) 102/69 (06/30 0431) SpO2:  [90 %-97 %] 97 % (06/29 2140)  Intake/Output from previous day: 06/29 0701 - 06/30 0700 In: 1845 [P.O.:220; I.V.:1375] Out: 1115 [Urine:1000; Drains:115] Intake/Output this shift: No intake/output data recorded.  Physical Exam:  Constitutional: Vital signs reviewed. WD WN in NAD   Eyes: PERRL, No scleral icterus.   Cardiovascular: RRR Pulmonary/Chest: Normal effort Extremities: No cyanosis or edema   Lab Results: Recent Labs    11/13/17 1243 11/13/17 2146 11/14/17 0810  HGB 8.3* 8.6* 8.5*  HCT 24.4* 25.3* 24.5*   BMET Recent Labs    11/13/17 0601 11/14/17 0758  NA 139 137  K 3.5 3.7  CL 113* 109  CO2 20* 20*  GLUCOSE 118* 93  BUN 11 5*  CREATININE 1.20* 1.10*  CALCIUM 7.0* 7.2*   Recent Labs    11/12/17 1110 11/12/17 1519 11/13/17 0601  INR 1.66 1.25 1.44   No results for input(s): LABURIN in the last 72 hours. Results for orders placed or performed during the hospital encounter of 11/12/17  MRSA PCR Screening     Status: None   Collection Time: 11/12/17  3:10 PM  Result Value Ref Range Status   MRSA by PCR NEGATIVE NEGATIVE Final    Comment:        The GeneXpert MRSA Assay (FDA approved for NASAL specimens only), is one component of a comprehensive MRSA colonization surveillance program. It is not intended to diagnose MRSA infection nor to guide or monitor treatment for MRSA infections. Performed at [facility], [place] [address] [place],  [address]     Bladder irrigated w/ 500 cc ns--few clots liberated  Studies/Results: No results found.  Assessment/Plan:   POD 3 repair bladder injury. Still w/ hematuria but bladder  irrigates fine. Will leave catheter in for extended time after closure. Continue irrigations.   LOS: 3 days   [name] 11/15/2017, 9:01 AM

## 2017-11-15 NOTE — Progress Notes (Signed)
Patient ID: MARGIT BATTE, female   DOB: 21-Jul-1975, 42 y.o.   MRN: [id number]                                                                PROGRESS NOTE                                                                                                                                                                                                             Patient Demographics:    Kathleen Cole, is a 42 y.o. female, DOB - 1975/07/29, GYJ:[id number]  Admit date - 11/12/2017   Admitting Physician Delsa Bern, MD  Outpatient Primary MD for the patient is Patient, No Pcp Per  LOS - 3  Outpatient Specialists:     No chief complaint on file.    Anemia  Brief Narrative   42 y.o.female P: 2-0-0-2 who presents for hysterectomy because of very large symptomatic uterine fibroids and menorrhagia. For over 10 years the patient has had enlarging fibroids and describes a menstrual flow lasting for 5 days. During that flow she will change her pad every 1.5 hours and occasionally, more often, due to clots . Pelvic cramping, rated 10/10 on a 10 point pain scale, accompanies her period, but will be relieved to 5/10 with Ibuprofen 800 mg. She goes on to report urinary frequency due to the pressure of her fibroids, occasional inter-menstrual spotting but denies dyspareunia or constipation. A pelvic ultrasound , May 2018 revealed: anteverted uterus ( 28 cm from fundus to external os) 28.61 x 14.65 x 20.78 cm, pedunculated lower uterine quadrant fibroid-8.27 cm; left ovary-4.94 cm and right ovary-not seen. An endometrial biopsy was not able to be performed on this patient secondary to uterine enlargement displacing her cervix out of view. A CBC and CMET in January 2018 by her primary care physician was within normal limits. Hemoglobin at that time was 11.4. A review of both medical and cervical management options were given to the patient however, she has decided to proceed with an abdominal  hysterectomy for definitive management.  TAH and bilateral salpingetomy by Delsa Bern 11/12/2017  Bilateral ureteral catheterizations and closure of cystostomy 11/12/2017 Donia Ast  Surgical course complicted by heomrrhage from varicose veins in the pelvis.  Transfused 6 uints of PRBC and 3 units FFP  and 1 unit platelets and cryopreceiptate.    Transferred to [facility] 11/12/2017 to PCCM care for hemorrhagic shock due to complications of fibroid surgery  Transfused 2units platelets 11/12/2017 by pccm Magnesium and potassium and phosphorus repleted 11/12/2017.   Transfused 1 unit prbc on 11/13/2017 Extubated on 11/13/2017  Transferred to [facility] on 11/14/2017    Subjective:    Kathleen Cole today has been doing well.  Slight bladder spasm.  Tolerating food, had bm yesterday.   No headache, No chest pain, No abdominal pain - No Nausea, No new weakness tingling or numbness, No Cough - SOB.   Assessment  & Plan :    Active Problems:   Hemorrhagic shock ([place])   Fibroid, uterine    Fibroids s/p TAH BSO 11/12/2017 Katharine Look [place]) Bilateral ureteral catheterizations and closure of cystostomy 11/12/2017 Elta Guadeloupe B[place]) JP drain in place Foley in place Appreciate gynecology and urology input  Hemorrhagic shock/ Thrombocytopenia S/p transfusion 6 units prbc (6/27) 3 units FFP (6/27) 1 unit platelet (6/27) 1 unit cryoprecipitate (6/27) 2 units platelet (6/27) 1 unit PRBC (6/28) Check cbc in am  Hypokalemia, Hypomagnesemia, hypophosphatemia S/p repletion Replete phosphorus Check cmp in am, phos in am  Hyperglycemia Monitor  Severe protein calorie malnutrition prostat 30 mL po bid  Code Status :  FULL CODE  Family Communication  : w patient  Disposition Plan  : home  Barriers For Discharge :   Consults  :  Urology, gynecology  Procedures  :   Fibroids s/p TAH BSO 11/12/2017 Katharine Look [place]) Bilateral ureteral catheterizations and closure of cystostomy  11/12/2017 Donia Ast)   DVT Prophylaxis  :  SCDs   RecentLabs       Lab Results  Component Value Date   PLT 77 (L) 11/13/2017      Antibiotics  :  Cefotetan 6/27=> Bactrim DS 6/29=>      Anti-infectives (From admission, onward)   Start     Dose/Rate Route Frequency Ordered Stop   11/14/17 1000  cefoTEtan (CEFOTAN) 2 g in sodium chloride 0.9 % 100 mL IVPB     2 g 200 mL/hr over 30 Minutes Intravenous Every 12 hours 11/14/17 0124 11/14/17 2341   11/14/17 1000  sulfamethoxazole-trimethoprim (BACTRIM,SEPTRA) 400-80 MG per tablet 1 tablet  Status:  Discontinued    Note to Pharmacy:  BLADDER INJURY/REPAIR INTRAOPERATIVELY   1 tablet Oral Every 12 hours 11/14/17 0124 11/14/17 0129   11/14/17 0130  sulfamethoxazole-trimethoprim (BACTRIM DS,SEPTRA DS) 800-160 MG per tablet 1 tablet     1 tablet Oral Every 12 hours 11/14/17 0124 11/20/17 2159   11/12/17 1230  cefoTEtan (CEFOTAN) 2 g in sodium chloride 0.9 % 100 mL IVPB  Status:  Discontinued     2 g 200 mL/hr over 30 Minutes Intravenous Every 12 hours 11/12/17 1221 11/14/17 0124   11/12/17 1000  cefoTEtan (CEFOTAN) 2 g in sodium chloride 0.9 % 100 mL IVPB  Status:  Discontinued     2 g 200 mL/hr over 30 Minutes Intravenous Every 12 hours 11/12/17 0953 11/12/17 1223   11/12/17 0601  sodium chloride 0.9 % with cefoTEtan (CEFOTAN) ADS Med    Note to Pharmacy:  Remer Macho, [facility]   : cabinet override      11/12/17 0601 11/12/17 0737   11/12/17 0600  cefoTEtan (CEFOTAN) 2 g in sodium chloride 0.9 % 100 mL IVPB     2 g 200 mL/hr over 30 Minutes Intravenous On call to O.R. 11/12/17 0011 11/12/17 1239  Objective:   Vitals:   11/14/17 0819 11/14/17 1321 11/14/17 2140 11/15/17 0431  BP:  98/67 92/67 102/69  Pulse:  (!) 102 96 81  Resp: (!) 26   16  Temp:  99.8 F (37.7 C) 99.2 F (37.3 C) 98.7 F (37.1 C)  TempSrc:  Oral Oral Oral  SpO2: 98% 90% 97%   Weight:      Height:        Wt Readings from Last 3 Encounters:   11/13/17 81.6 kg (179 lb 14.3 oz)  11/06/17 77.6 kg (171 lb)     Intake/Output Summary (Last 24 hours) at 11/15/2017 0939 Last data filed at 11/15/2017 0600 Gross per 24 hour  Intake 1845 ml  Output 1115 ml  Net 730 ml     Physical Exam  Awake Alert, Oriented X 3, No new F.N deficits, Normal affect [place].AT,PERRAL Supple Neck,No JVD, No cervical lymphadenopathy appriciated.  Symmetrical Chest wall movement, Good air movement bilaterally, CTAB RRR,No Gallops,Rubs or new Murmurs, No Parasternal Heave +ve B.Sounds, Abd Soft, No tenderness, No organomegaly appriciated, No rebound - guarding or rigidity. No Cyanosis, Clubbing or edema, No new Rash or bruise   jp in place Foley in place   Data Review:    CBC Recent Labs  Lab 11/12/17 2053 11/13/17 0240 11/13/17 1243 11/13/17 2146 11/14/17 0810  WBC 10.5 10.8* 11.4* 11.9* 13.8*  HGB 9.1* 7.9* 8.3* 8.6* 8.5*  HCT 26.1* 23.3* 24.4* 25.3* 24.5*  PLT 69* 71* 63* 77* 76*  MCV 82.1 82.9 83.8 84.9 83.3  MCH 28.6 28.1 28.5 28.9 28.9  MCHC 34.9 33.9 34.0 34.0 34.7  RDW 15.2 15.4 15.1 15.3 15.2    Chemistries  Recent Labs  Lab 11/12/17 1037 11/12/17 1110 11/12/17 1519 11/13/17 0601 11/14/17 0758  NA 142 136 141 139 137  K 4.0 4.0 3.9 3.5 3.7  CL  --  113* 113* 113* 109  CO2  --  20* 20* 20* 20*  GLUCOSE  --  185* 148* 118* 93  BUN  --  8 7 11  5*  CREATININE  --  0.81 0.99 1.20* 1.10*  CALCIUM  --  6.2* 7.3* 7.0* 7.2*  MG  --   --  1.4* 1.9 1.9  AST  --  13* 18  --  31  ALT  --  8 12  --  8  ALKPHOS  --  27* 28*  --  42  BILITOT  --  0.6 0.9  --  0.7   ------------------------------------------------------------------------------------------------------------------ Recent Labs    11/12/17 1628  TRIG 55    No results found for: HGBA1C ------------------------------------------------------------------------------------------------------------------ No results for input(s): TSH, T4TOTAL, T3FREE, THYROIDAB in the  last 72 hours.  Invalid input(s): FREET3 ------------------------------------------------------------------------------------------------------------------ No results for input(s): VITAMINB12, FOLATE, FERRITIN, TIBC, IRON, RETICCTPCT in the last 72 hours.  Coagulation profile Recent Labs  Lab 11/12/17 1110 11/12/17 1519 11/13/17 0601  INR 1.66 1.25 1.44    Recent Labs    11/12/17 1110  DDIMER 1.53*    Cardiac Enzymes Recent Labs  Lab 11/12/17 1519  TROPONINI <0.03   ------------------------------------------------------------------------------------------------------------------ No results found for: BNP  Inpatient Medications  Scheduled Meds: . bupivacaine-phenazopyridine (MARCAINE-PYRIDIUM) bladder mixture   Bladder Instillation Once  . docusate sodium  100 mg Oral BID  . phenazopyridine  100 mg Oral TID WC  . potassium & sodium phosphates  1 packet Oral TID WC & HS  . sulfamethoxazole-trimethoprim  1 tablet Oral Q12H   Continuous Infusions: . sodium chloride  Stopped (11/12/17 2124)  . lactated ringers 125 mL/hr at 11/15/17 0707   PRN Meds:.sodium chloride, acetaminophen, albuterol, HYDROmorphone (DILAUDID) injection, lidocaine, menthol-cetylpyridinium, metoCLOPramide, ondansetron, oxyCODONE-acetaminophen  Micro Results Recent Results (from the past 240 hour(s))  MRSA PCR Screening     Status: None   Collection Time: 11/12/17  3:10 PM  Result Value Ref Range Status   MRSA by PCR NEGATIVE NEGATIVE Final    Comment:        The GeneXpert MRSA Assay (FDA approved for NASAL specimens only), is one component of a comprehensive MRSA colonization surveillance program. It is not intended to diagnose MRSA infection nor to guide or monitor treatment for MRSA infections. Performed at [facility], [place] [address] [place], [place] [address]     Radiology Reports Dg Chest Port 1 View  Result Date: 11/12/2017 CLINICAL DATA:  Ett placed EXAM: PORTABLE CHEST  1 VIEW COMPARISON:  None. FINDINGS: An endotracheal tube is in place, tip approximately 1.7 centimeters above the carina. Heart size is normal. There are no focal consolidations or pleural effusions. No pulmonary edema. IMPRESSION: Two endotracheal tube in place, 1.7 centimeters above carina. Electronically Signed   By: Nolon Nations M.D.   On: 11/12/2017 15:52   Dg Abd Portable 1v  Result Date: 11/12/2017 CLINICAL DATA:  Evaluate OG tube EXAM: PORTABLE ABDOMEN - 1 VIEW COMPARISON:  None. FINDINGS: The OG tube terminates in the distal stomach. There is some sort a catheter or drain in the pelvis. IMPRESSION: 1. The distal tip of the OG tube is in the distal stomach. 2. Some sort a catheter or drain is seen in the pelvis. Electronically Signed   By: Dorise Bullion III M.D   On: 11/12/2017 16:06    Time Spent in minutes  30   Jani Gravel M.D on 11/15/2017 at 9:39 AM  Between 7am to 7pm - Pager - [phone]   After 7pm go to [email or web address] - password [facility]  [facility] -  Office  [phone]

## 2017-11-15 NOTE — Progress Notes (Addendum)
3 Days Post-Op Procedure(s) (LRB): HYSTERECTOMY ABDOMINAL WITH BILATERAL SALPINGECTOMY WITH REPAIR OF RETROPERIOTONEAL VENOUS BLEED (REPAIRED BY DR. Harrell Gave DICKSON FROM VASCULAR)( IN AT 11:25 OUT 11:50) (Bilateral) URETERAL CATHETER  PLACEMENT BILATERAL, CLOSURE CYSTOTOMY (Bilateral)  Subjective: Patient reports +flatus and pt told by RN that a small BM was noted in toilet as well.  Pt, however, was not aware of it although had been trying to have a BM.  She denies feeling bloated or distended.  She tolerated toast and bacon this morning without N/V.  Foley is in place and RN reports pt feels bladder spasms when foley is flushed but otherwise she is tolerating it well.  Ambulated in hallways with RN and denied HA, light headedness or dizziness but RN reported that the pt c/o feeling weak.  Objective: I have reviewed patient's vital signs, intake and output, medications and labs. T 100.6 11/14/17 0120 Tm = 99.8 at 1321 11/14/17 JP 30cc/6hrs (4am - 10am) and 20cc now UOP 1000cc/10hrs  General: alert and no distress Resp: clear to auscultation bilaterally Cardio: regular rate and rhythm GI: feels slightly distended (although pt says it feels normal to her), appropriately tender, no rebound or guarding, soft NABS, dressing c/d/i, JP drain intact with about 20cc in it currently.  No BCVAT. Extremities: SCDs are on, no calf tenderness  Assessment: s/p Procedure(s) with comments: HYSTERECTOMY ABDOMINAL WITH BILATERAL SALPINGECTOMY WITH REPAIR OF RETROPERIOTONEAL VENOUS BLEED (REPAIRED BY DR. Harrell Gave DICKSON FROM VASCULAR)( IN AT 11:25 OUT 11:50) (Bilateral) URETERAL CATHETER  PLACEMENT BILATERAL, CLOSURE CYSTOTOMY (Bilateral) - Dr. Karsten Ro arrived at 1030 out at 1115 for urology procedures: stable with gradual daily improvement  Plan: CBC in am.  I discussed with pt as her fluid shifts occur her Hgb has drifted downward gradually.  Currently she is asymptomatic but I discussed the possiblity  of an additional 2uPRBCs prior to going home to aid recovery.  She seems open to the idea.  Hgb 7.3 from 8.5. Thrombocytopenia improving.  Plts 95 from 76 Appreciate input from Medicine and Urology From Gyn perspective, she may be ready to go home tomorrow or Tuesday.  She has a good support system and her husband will be home with her this upcoming week.   Foley to remain in place per urology and pt will f/u with urology as an outpatient.  She currently still has hematuria.  Bladder spasms only occur with flushes.  Will hold on bupivacaine/pyridium flush (there is also some instruction needed to be given to nursing if done) JP drain is draining less.  I anticipate it being d/c'd prior to discharge. Questions answered  LOS: 3 days    Delice Lesch 11/15/2017, 11:18 AM

## 2017-11-16 LAB — COMPREHENSIVE METABOLIC PANEL
ALBUMIN: 2.3 g/dL — AB (ref 3.5–5.0)
ALT: 15 U/L (ref 0–44)
AST: 23 U/L (ref 15–41)
Alkaline Phosphatase: 63 U/L (ref 38–126)
Anion gap: 9 (ref 5–15)
BUN: 5 mg/dL — AB (ref 6–20)
CO2: 23 mmol/L (ref 22–32)
Calcium: 7.6 mg/dL — ABNORMAL LOW (ref 8.9–10.3)
Chloride: 107 mmol/L (ref 98–111)
Creatinine, Ser: 0.91 mg/dL (ref 0.44–1.00)
GFR calc Af Amer: >60 mL/min (ref >60)
Glucose, Bld: 94 mg/dL (ref 70–99)
POTASSIUM: 3.3 mmol/L — AB (ref 3.5–5.1)
SODIUM: 139 mmol/L (ref 135–145)
Total Bilirubin: 0.5 mg/dL (ref 0.3–1.2)
Total Protein: 4.9 g/dL — ABNORMAL LOW (ref 6.5–8.1)

## 2017-11-16 LAB — CBC
HCT: 21.3 % — ABNORMAL LOW (ref 36.0–46.0)
Hemoglobin: 7.3 g/dL — ABNORMAL LOW (ref 12.0–15.0)
MCH: 28.6 pg (ref 26.0–34.0)
MCHC: 34.3 g/dL (ref 30.0–36.0)
MCV: 83.5 fL (ref 78.0–100.0)
Platelets: 122 10*3/uL — ABNORMAL LOW (ref 150–400)
RBC: 2.55 MIL/uL — ABNORMAL LOW (ref 3.87–5.11)
RDW: 15.1 % (ref 11.5–15.5)
WBC: 6 10*3/uL (ref 4.0–10.5)

## 2017-11-16 LAB — PREPARE RBC (CROSSMATCH)

## 2017-11-16 LAB — PHOSPHORUS: PHOSPHORUS: 2.6 mg/dL (ref 2.5–4.6)

## 2017-11-16 LAB — CREATININE, FLUID (PLEURAL, PERITONEAL, JP DRAINAGE): Creat, Fluid: 0.9 mg/dL

## 2017-11-16 MED ORDER — SODIUM CHLORIDE 0.9% IV SOLUTION
Freq: Once | INTRAVENOUS | Status: AC
  Administered 2017-11-16: 11:00:00 via INTRAVENOUS

## 2017-11-16 MED ORDER — MIRABEGRON ER 25 MG PO TB24
25.0000 mg | ORAL_TABLET | Freq: Every day | ORAL | Status: DC
  Administered 2017-11-16 – 2017-11-17 (×2): 25 mg via ORAL
  Filled 2017-11-16 (×2): qty 1

## 2017-11-16 MED ORDER — ACETAMINOPHEN 325 MG PO TABS
650.0000 mg | ORAL_TABLET | Freq: Once | ORAL | Status: AC
  Administered 2017-11-16: 650 mg via ORAL
  Filled 2017-11-16: qty 2

## 2017-11-16 MED ORDER — BACITRACIN ZINC 500 UNIT/GM EX OINT
TOPICAL_OINTMENT | Freq: Two times a day (BID) | CUTANEOUS | Status: DC
  Administered 2017-11-16 – 2017-11-17 (×2): via TOPICAL
  Filled 2017-11-16: qty 28.35

## 2017-11-16 MED ORDER — DIPHENHYDRAMINE HCL 25 MG PO CAPS
25.0000 mg | ORAL_CAPSULE | Freq: Once | ORAL | Status: AC
  Administered 2017-11-16: 25 mg via ORAL
  Filled 2017-11-16: qty 1

## 2017-11-16 NOTE — Progress Notes (Signed)
Started of Blood product

## 2017-11-16 NOTE — Discharge Instructions (Signed)
Call [facility] @ [phone] if:  You have a temperature greater than or equal to 100.4 degrees Farenheit orally You have pain that is not made better by the pain medication given and taken as directed You have excessive bleeding or problems urinating  Take Colace (Docusate Sodium/Stool Softener) 100 mg 2-3 times daily while taking narcotic pain medicine to avoid constipation or until bowel movements are regular.  You may drive after 2 weeks You may walk up steps  You may shower  You may resume a regular diet  Keep incisions clean and dry, remove honeycomb dressing on November 19, 2017 Do not lift over 15 pounds for 6 weeks Avoid anything in vagina for 6 weeks (or until after your post-operative visit)  Call [facility], once you are at home,   for a post operative appointment with Dr. Karsten Ro [phone]  Keep Post Operative appointment with Dr. Cletis Media on December 22, 2017 at 10:30 a.m.

## 2017-11-16 NOTE — Progress Notes (Signed)
Patient ID: Kathleen Cole, female   DOB: 06-18-1975, 42 y.o.   MRN: [id number]    Assessment: Cystotomy: Doing well with continued improvement and a normal creatinine.  Her drain output the past 24 hours was 140 cc.  It appears serosanguineous.  I will send drain fluid for creatinine.  If negative for urine her drain can be removed prior to discharge.  We discussed the need to leave the Foley catheter in again today.  I told her I would like it to be in for 2 weeks and then will have her return as an outpatient for cystogram prior to catheter removal. She has been having some mild bladder spasms so I have started Myrbetriq 25 mg.   Plan:  1.  JP drain fluid for creatinine. 2.  Begin Myrbetriq 25 mg. 3.  Would continue Septra RS upon discharge. 4.  She will follow-up with me in 2 weeks as an outpatient.   Subjective: Patient reports she is feeling much better overall.  She does report occasionally experiencing mild bladder spasms.  Objective: Vital signs in last 24 hours: Temp:  [98.2 F (36.8 C)-98.9 F (37.2 C)] 98.2 F (36.8 C) (07/01 0512) Pulse Rate:  [73-85] 73 (07/01 0512) Resp:  [18-20] 20 (07/01 0512) BP: (102-122)/(68-77) 122/74 (07/01 0512) SpO2:  [100 %] 100 % (07/01 0512)A  Intake/Output from previous day: 06/30 0701 - 07/01 0700 In: 1625 [I.V.:1375] Out: 3390 [Urine:3250; Drains:140] Intake/Output this shift: No intake/output data recorded.  Past Medical History:  Diagnosis Date  . Anemia   . Asthma    rarely uses inhaler -uses when patient gets sick  . Seasonal allergies   . SVD (spontaneous vaginal delivery)    x 2    Physical Exam:  General: Awake, alert and in no apparent distress Lungs: Normal respiratory effort, chest expands symmetrically.  Abdomen: Soft, non-tender & non-distended.  Dressing dry and intact. GU: Foley catheter draining blood-tinged urine with no clots.  Lab Results: Recent Labs    11/13/17 2146 11/14/17 0810 11/15/17 0824    WBC 11.9* 13.8* 9.8  HGB 8.6* 8.5* 7.3*  HCT 25.3* 24.5* 21.3*   BMET Recent Labs    11/14/17 0758 11/15/17 0824  NA 137 138  K 3.7 3.5  CL 109 108  CO2 20* 25  GLUCOSE 93 105*  BUN 5* <5*  CREATININE 1.10* 1.00  CALCIUM 7.2* 7.4*   No results for input(s): LABURIN in the last 72 hours. Results for orders placed or performed during the hospital encounter of 11/12/17  MRSA PCR Screening     Status: None   Collection Time: 11/12/17  3:10 PM  Result Value Ref Range Status   MRSA by PCR NEGATIVE NEGATIVE Final    Comment:        The GeneXpert MRSA Assay (FDA approved for NASAL specimens only), is one component of a comprehensive MRSA colonization surveillance program. It is not intended to diagnose MRSA infection nor to guide or monitor treatment for MRSA infections. Performed at [facility], [place] [address], [place], [place] [address]     Studies/Results: No results found.    Nashla Althoff C 11/16/2017, 7:27 AM     No specific complaints.

## 2017-11-16 NOTE — Progress Notes (Signed)
Patient requests that IV be restarted after she gets a bath.  Ok to wait per Vinnie Level RN and she will reenter order once this is complete.  Carolee Rota, RN VAST

## 2017-11-16 NOTE — Progress Notes (Signed)
Kathleen Cole is a1 y.o.  [id number]  Post Op Date # 4:  Total Abdominal Hysterectomy, Bilateral Salpingectomy, Repair of Cystotomy, Cannulation of Bilateral Ureters and Repair of Retroperitoneal Bleed and Massive Transfusion Protocol    Subjective: Patient is Doing well postoperatively. Patient has Pain is controlled with current analgesics. Medications being used: narcotic analgesics including Perocet 5/325. The patient has not had to take any pain medicine on a regular basis,  states she is sore but not in pain except bladder spasms with Foley irrigation.  Tolerating a regular diet, passing flatus and ambulating with transient weakness and lightheadedness upon standing.    Objective: Vital signs in last 24 hours: Temp:  [98.2 F (36.8 C)-98.9 F (37.2 C)] 98.2 F (36.8 C) (07/01 0512) Pulse Rate:  [73-85] 73 (07/01 0512) Resp:  [18-20] 20 (07/01 0512) BP: (102-122)/(68-77) 122/74 (07/01 0512) SpO2:  [100 %] 100 % (07/01 0512)  Intake/Output from previous day: 06/30 0701 - 07/01 0700 In: 1625 [I.V.:1375] Out: 3390 [Urine:3250; Drains:140] Intake/Output this shift: No intake/output data recorded. Recent Labs  Lab 11/13/17 2146 11/14/17 0810 11/15/17 0824  WBC 11.9* 13.8* 9.8  HGB 8.6* 8.5* 7.3*  HCT 25.3* 24.5* 21.3*  PLT 77* 76* 95*     Recent Labs  Lab 11/12/17 1519 11/13/17 0601 11/14/17 0758 11/15/17 0824  NA 141 139 137 138  K 3.9 3.5 3.7 3.5  CL 113* 113* 109 108  CO2 20* 20* 20* 25  BUN 7 11 5* <5*  CREATININE 0.99 1.20* 1.10* 1.00  CALCIUM 7.3* 7.0* 7.2* 7.4*  PROT 4.6*  --  4.5* 4.6*  BILITOT 0.9  --  0.7 0.5  ALKPHOS 28*  --  42 57  ALT 12  --  8 15  AST 18  --  31 31  GLUCOSE 148* 118* 93 105*    EXAM: General: alert, cooperative and no distress Resp: clear to auscultation bilaterally Cardio: RRR with no murmur GI: bowel sounds present in all 4 quadrants, soft and honeycomb dressing dry and intact.  A single dime sized, dry stain on  right lower aspect. Extremities: Homans sign is negative, no sign of DVT and no calf tenderness. Vaginal Bleeding: none   Assessment: s/p Procedure(s): HYSTERECTOMY ABDOMINAL WITH BILATERAL SALPINGECTOMY WITH REPAIR OF RETROPERIOTONEAL VENOUS BLEED (REPAIRED BY DR. Harrell Gave DICKSON FROM VASCULAR)( IN AT 11:25 OUT 11:50) URETERAL CATHETER  PLACEMENT BILATERAL, CLOSURE CYSTOTOMY: stable, progressing well, tolerating diet, anemia and Hypokalemia, Hypophosphatemia, Hypomagnesemia and Hyperglycemia.  Plan: Encourage ambulation Per Hospitalist-Dr. Maudie Mercury,  will transfuse 2 Units of PRBC today.  Routine care  LOS: 4 days    Earnstine Regal, PA-C 11/16/2017 7:39 AM

## 2017-11-16 NOTE — Progress Notes (Signed)
Patient ID: Kathleen Cole, female   DOB: 20-Sep-1975, 42 y.o.   MRN: [id number]                                                                PROGRESS NOTE                                                                                                                                                                                                             Patient Demographics:    Kathleen Cole, is a 42 y.o. female, DOB - June 18, 1975, TKZ:[id number]  Admit date - 11/12/2017   Admitting Physician Delsa Bern, MD  Outpatient Primary MD for the patient is Patient, No Pcp Per  LOS - 4  Outpatient Specialists: Katharine Look Rivard  No chief complaint on file. hemorrhaghic shock     Brief Narrative  42 y.o.female P: 2-0-0-2 who presents for hysterectomy because of very large symptomatic uterine fibroids and menorrhagia. For over 10 years the patient has had enlarging fibroids and describes a menstrual flow lasting for 5 days. During that flow she will change her pad every 1.5 hours and occasionally, more often, due to clots . Pelvic cramping, rated 10/10 on a 10 point pain scale, accompanies her period, but will be relieved to 5/10 with Ibuprofen 800 mg. She goes on to report urinary frequency due to the pressure of her fibroids, occasional inter-menstrual spotting but denies dyspareunia or constipation. A pelvic ultrasound , May 2018 revealed: anteverted uterus ( 28 cm from fundus to external os) 28.61 x 14.65 x 20.78 cm, pedunculated lower uterine quadrant fibroid-8.27 cm; left ovary-4.94 cm and right ovary-not seen. An endometrial biopsy was not able to be performed on this patient secondary to uterine enlargement displacing her cervix out of view. A CBC and CMET in January 2018 by her primary care physician was within normal limits. Hemoglobin at that time was 11.4. A review of both medical and cervical management options were given to the patient however, she has decided to  proceed with an abdominal hysterectomy for definitive management.  TAH and bilateral salpingetomy by Delsa Bern 11/12/2017  Bilateral ureteral catheterizations and closure of cystostomy 11/12/2017 Donia Ast  Surgical course complicted by heomrrhage from varicose veins in the pelvis. Transfused 6 uints of PRBC and 3 units FFP and 1  unit platelets and cryopreceiptate.   Transferred to [facility] 11/12/2017 to PCCM care for hemorrhagic shock due to complications of fibroid surgery  Transfused 2units platelets 11/12/2017 by pccm Magnesium and potassium and phosphorus repleted 11/12/2017.   Transfused 1 unit prbc on 11/13/2017 Extubated on 11/13/2017  [facility] 11/14/2017      Subjective:    Kathleen Cole today has been afebrile overnite.  Doing well.   No headache, No chest pain, No abdominal pain - No Nausea, No new weakness tingling or numbness, No Cough - SOB.    Assessment  & Plan :    Active Problems:   Hemorrhagic shock ([place])   Fibroid, uterine   Severe protein-calorie malnutrition ([place])    Fibroids s/p TAH BSO 11/12/2017 Katharine Look Rivard) Bilateral ureteral catheterizations and closure of cystostomy 11/12/2017 Elta Guadeloupe Joanna) JP drain in place Foley in place Appreciate gynecology and urology input  Hemorrhagic shock/ Thrombocytopenia S/p transfusion 6 units prbc (6/27) 3 units FFP (6/27) 1 unit platelet (6/27) 1 unit cryoprecipitate (6/27) 2 units platelet (6/27) 1 unit PRBC (6/28) Since her hgb 7.3 yesterday and still having some blood loss will transfuse 2 units  prbc Check cbc in am,  Hypokalemia, Hypomagnesemia, hypophosphatemia S/p repletion Replete phosphorus Check cmp in am, phos in am  Hyperglycemia Monitor  Severe protein calorie malnutrition prostat 30 mL po bid  Code Status:FULL CODE  Family Communication :w patient  Disposition Plan:home  Barriers For Discharge:  Consults :Urology,  gynecology  Procedures : Fibroids s/p TAH BSO 11/12/2017 Katharine Look Rivard) Bilateral ureteral catheterizations and closure of cystostomy 11/12/2017 Donia Ast)   DVT Prophylaxis: SCDs  RecentLabs       Lab Results  Component Value Date   PLT 77 (L) 11/13/2017      Antibiotics :Cefotetan 6/27=> Bactrim DS 6/29=>           Anti-infectives (From admission, onward)   Start     Dose/Rate Route Frequency Ordered Stop   11/14/17 1000  cefoTEtan (CEFOTAN) 2 g in sodium chloride 0.9 % 100 mL IVPB     2 g 200 mL/hr over 30 Minutes Intravenous Every 12 hours 11/14/17 0124 11/14/17 2341   11/14/17 1000  sulfamethoxazole-trimethoprim (BACTRIM,SEPTRA) 400-80 MG per tablet 1 tablet  Status:  Discontinued    Note to Pharmacy:  BLADDER INJURY/REPAIR INTRAOPERATIVELY   1 tablet Oral Every 12 hours 11/14/17 0124 11/14/17 0129   11/14/17 0130  sulfamethoxazole-trimethoprim (BACTRIM DS,SEPTRA DS) 800-160 MG per tablet 1 tablet     1 tablet Oral Every 12 hours 11/14/17 0124 11/20/17 2159   11/12/17 1230  cefoTEtan (CEFOTAN) 2 g in sodium chloride 0.9 % 100 mL IVPB  Status:  Discontinued     2 g 200 mL/hr over 30 Minutes Intravenous Every 12 hours 11/12/17 1221 11/14/17 0124   11/12/17 1000  cefoTEtan (CEFOTAN) 2 g in sodium chloride 0.9 % 100 mL IVPB  Status:  Discontinued     2 g 200 mL/hr over 30 Minutes Intravenous Every 12 hours 11/12/17 0953 11/12/17 1223   11/12/17 0601  sodium chloride 0.9 % with cefoTEtan (CEFOTAN) ADS Med    Note to Pharmacy:  Remer Macho, [facility]   : cabinet override      11/12/17 0601 11/12/17 0737   11/12/17 0600  cefoTEtan (CEFOTAN) 2 g in sodium chloride 0.9 % 100 mL IVPB     2 g 200 mL/hr over 30 Minutes Intravenous On call to O.R. 11/12/17 0011 11/12/17 1239  Objective:   Vitals:   11/15/17 0431 11/15/17 1431 11/15/17 2003 11/16/17 0512  BP: 102/69 102/68 106/77 122/74  Pulse: 81 83 85 73  Resp: 16 18 20 20   Temp: 98.7 F  (37.1 C) 98.4 F (36.9 C) 98.9 F (37.2 C) 98.2 F (36.8 C)  TempSrc: Oral Oral Oral Oral  SpO2:  100% 100% 100%  Weight:      Height:        Wt Readings from Last 3 Encounters:  11/13/17 81.6 kg (179 lb 14.3 oz)  11/06/17 77.6 kg (171 lb)     Intake/Output Summary (Last 24 hours) at 11/16/2017 0707 Last data filed at 11/16/2017 0610 Gross per 24 hour  Intake 1625 ml  Output 3390 ml  Net -1765 ml     Physical Exam  Awake Alert, Oriented X 3, No new F.N deficits, Normal affect [place].AT,PERRAL Supple Neck,No JVD, No cervical lymphadenopathy appriciated.  Symmetrical Chest wall movement, Good air movement bilaterally, CTAB RRR,No Gallops,Rubs or new Murmurs, No Parasternal Heave +ve B.Sounds, Abd Soft, No tenderness, No organomegaly appriciated, No rebound - guarding or rigidity. No Cyanosis, Clubbing or edema, No new Rash or bruise Foley, in place , slightly bloody still.  JP in place    Data Review:    CBC Recent Labs  Lab 11/13/17 0240 11/13/17 1243 11/13/17 2146 11/14/17 0810 11/15/17 0824  WBC 10.8* 11.4* 11.9* 13.8* 9.8  HGB 7.9* 8.3* 8.6* 8.5* 7.3*  HCT 23.3* 24.4* 25.3* 24.5* 21.3*  PLT 71* 63* 77* 76* 95*  MCV 82.9 83.8 84.9 83.3 84.2  MCH 28.1 28.5 28.9 28.9 28.9  MCHC 33.9 34.0 34.0 34.7 34.3  RDW 15.4 15.1 15.3 15.2 15.3    Chemistries  Recent Labs  Lab 11/12/17 1110 11/12/17 1519 11/13/17 0601 11/14/17 0758 11/15/17 0824  NA 136 141 139 137 138  K 4.0 3.9 3.5 3.7 3.5  CL 113* 113* 113* 109 108  CO2 20* 20* 20* 20* 25  GLUCOSE 185* 148* 118* 93 105*  BUN 8 7 11  5* <5*  CREATININE 0.81 0.99 1.20* 1.10* 1.00  CALCIUM 6.2* 7.3* 7.0* 7.2* 7.4*  MG  --  1.4* 1.9 1.9 1.8  AST 13* 18  --  31 31  ALT 8 12  --  8 15  ALKPHOS 27* 28*  --  42 57  BILITOT 0.6 0.9  --  0.7 0.5   ------------------------------------------------------------------------------------------------------------------ No results for input(s): CHOL, HDL, LDLCALC, TRIG,  CHOLHDL, LDLDIRECT in the last 72 hours.  No results found for: HGBA1C ------------------------------------------------------------------------------------------------------------------ No results for input(s): TSH, T4TOTAL, T3FREE, THYROIDAB in the last 72 hours.  Invalid input(s): FREET3 ------------------------------------------------------------------------------------------------------------------ No results for input(s): VITAMINB12, FOLATE, FERRITIN, TIBC, IRON, RETICCTPCT in the last 72 hours.  Coagulation profile Recent Labs  Lab 11/12/17 1110 11/12/17 1519 11/13/17 0601  INR 1.66 1.25 1.44    No results for input(s): DDIMER in the last 72 hours.  Cardiac Enzymes Recent Labs  Lab 11/12/17 1519  TROPONINI <0.03   ------------------------------------------------------------------------------------------------------------------ No results found for: BNP  Inpatient Medications  Scheduled Meds: . docusate sodium  100 mg Oral BID  . feeding supplement (PRO-STAT SUGAR FREE 64)  30 mL Oral BID  . potassium & sodium phosphates  1 packet Oral TID WC & HS  . sulfamethoxazole-trimethoprim  1 tablet Oral Q12H   Continuous Infusions: . sodium chloride Stopped (11/12/17 2124)  . lactated ringers 125 mL/hr at 11/16/17 0316   PRN Meds:.sodium chloride, acetaminophen, albuterol, diphenhydrAMINE-zinc acetate, HYDROmorphone (DILAUDID)  injection, lidocaine, menthol-cetylpyridinium, metoCLOPramide, ondansetron, oxyCODONE-acetaminophen  Micro Results Recent Results (from the past 240 hour(s))  MRSA PCR Screening     Status: None   Collection Time: 11/12/17  3:10 PM  Result Value Ref Range Status   MRSA by PCR NEGATIVE NEGATIVE Final    Comment:        The GeneXpert MRSA Assay (FDA approved for NASAL specimens only), is one component of a comprehensive MRSA colonization surveillance program. It is not intended to diagnose MRSA infection nor to guide or monitor treatment  for MRSA infections. Performed at [facility], [place] [address] [place], [place] [address]     Radiology Reports Dg Chest Port 1 View  Result Date: 11/12/2017 CLINICAL DATA:  Ett placed EXAM: PORTABLE CHEST 1 VIEW COMPARISON:  None. FINDINGS: An endotracheal tube is in place, tip approximately 1.7 centimeters above the carina. Heart size is normal. There are no focal consolidations or pleural effusions. No pulmonary edema. IMPRESSION: Two endotracheal tube in place, 1.7 centimeters above carina. Electronically Signed   By: Nolon Nations M.D.   On: 11/12/2017 15:52   Dg Abd Portable 1v  Result Date: 11/12/2017 CLINICAL DATA:  Evaluate OG tube EXAM: PORTABLE ABDOMEN - 1 VIEW COMPARISON:  None. FINDINGS: The OG tube terminates in the distal stomach. There is some sort a catheter or drain in the pelvis. IMPRESSION: 1. The distal tip of the OG tube is in the distal stomach. 2. Some sort a catheter or drain is seen in the pelvis. Electronically Signed   By: Dorise Bullion III M.D   On: 11/12/2017 16:06    Time Spent in minutes  30   Jani Gravel M.D on 11/16/2017 at 7:07 AM  Between 7am to 7pm - Pager [phone]    After 7pm go to [email or web address] - password [facility]  [facility] -  Office  [phone]

## 2017-11-17 LAB — TYPE AND SCREEN
ABO/RH(D): O POS
Antibody Screen: NEGATIVE
Unit division: 0
Unit division: 0
Unit division: 0

## 2017-11-17 LAB — BPAM RBC
Blood Product Expiration Date: 201908022359
Blood Product Expiration Date: 201908032359
Blood Product Expiration Date: 201908032359
ISSUE DATE / TIME: 201907011023
ISSUE DATE / TIME: 201907011340
ISSUE DATE / TIME: 201907011825
UNIT TYPE AND RH: 5100
UNIT TYPE AND RH: 5100
Unit Type and Rh: 5100

## 2017-11-17 LAB — COMPREHENSIVE METABOLIC PANEL
ALK PHOS: 60 U/L (ref 38–126)
ALT: 16 U/L (ref 0–44)
AST: 21 U/L (ref 15–41)
Albumin: 2.3 g/dL — ABNORMAL LOW (ref 3.5–5.0)
Anion gap: 5 (ref 5–15)
CALCIUM: 7.9 mg/dL — AB (ref 8.9–10.3)
CO2: 26 mmol/L (ref 22–32)
CREATININE: 0.92 mg/dL (ref 0.44–1.00)
Chloride: 108 mmol/L (ref 98–111)
GFR calc non Af Amer: >60 mL/min (ref >60)
GLUCOSE: 94 mg/dL (ref 70–99)
Potassium: 3.7 mmol/L (ref 3.5–5.1)
Sodium: 139 mmol/L (ref 135–145)
Total Bilirubin: 0.5 mg/dL (ref 0.3–1.2)
Total Protein: 4.9 g/dL — ABNORMAL LOW (ref 6.5–8.1)

## 2017-11-17 LAB — CBC
HCT: 28.2 % — ABNORMAL LOW (ref 36.0–46.0)
HEMOGLOBIN: 9.4 g/dL — AB (ref 12.0–15.0)
MCH: 28.1 pg (ref 26.0–34.0)
MCHC: 33.3 g/dL (ref 30.0–36.0)
MCV: 84.2 fL (ref 78.0–100.0)
Platelets: 134 10*3/uL — ABNORMAL LOW (ref 150–400)
RBC: 3.35 MIL/uL — AB (ref 3.87–5.11)
RDW: 15 % (ref 11.5–15.5)
WBC: 5 10*3/uL (ref 4.0–10.5)

## 2017-11-17 LAB — PHOSPHORUS: Phosphorus: 3.1 mg/dL (ref 2.5–4.6)

## 2017-11-17 MED ORDER — PRO-STAT SUGAR FREE PO LIQD: 30.0000 mL | Freq: Two times a day (BID) | ORAL | 0 refills | Status: DC

## 2017-11-17 MED ORDER — DIPHENHYDRAMINE-ZINC ACETATE 2-0.1 % EX CREA: TOPICAL_CREAM | Freq: Three times a day (TID) | CUTANEOUS | 0 refills | Status: DC | PRN

## 2017-11-17 MED ORDER — OXYCODONE-ACETAMINOPHEN 5-325 MG PO TABS: 1.0000 | ORAL_TABLET | Freq: Four times a day (QID) | ORAL | 0 refills | Status: DC | PRN

## 2017-11-17 MED ORDER — DOCUSATE SODIUM 100 MG PO CAPS: 100.0000 mg | ORAL_CAPSULE | Freq: Two times a day (BID) | ORAL | 0 refills | Status: DC

## 2017-11-17 MED ORDER — LIDOCAINE HCL URETHRAL/MUCOSAL 2 % EX GEL: 1.0000 "application " | Freq: Three times a day (TID) | CUTANEOUS | 0 refills | Status: DC | PRN

## 2017-11-17 MED ORDER — BACITRACIN ZINC 500 UNIT/GM EX OINT: TOPICAL_OINTMENT | Freq: Two times a day (BID) | CUTANEOUS | 0 refills | Status: DC

## 2017-11-17 MED ORDER — ONDANSETRON HCL 4 MG PO TABS: 4.0000 mg | ORAL_TABLET | Freq: Three times a day (TID) | ORAL | 0 refills | Status: DC | PRN

## 2017-11-17 MED ORDER — SULFAMETHOXAZOLE-TRIMETHOPRIM 800-160 MG PO TABS: 1.0000 | ORAL_TABLET | Freq: Two times a day (BID) | ORAL | 0 refills | Status: DC

## 2017-11-17 MED ORDER — MIRABEGRON ER 25 MG PO TB24: 25.0000 mg | ORAL_TABLET | Freq: Every day | ORAL | 0 refills | Status: DC

## 2017-11-17 NOTE — Discharge Summary (Signed)
ROSANGELICA PEVEHOUSE, is a 42 y.o. female  DOB [birth year]  MRN [id number].  Admission date:  11/12/2017  Admitting Physician  Delsa Bern, MD  Discharge Date:  11/17/2017   Primary MD  Patient, No Pcp Per  Recommendations for primary care physician for things to follow:    Fibroids s/p TAH BSO 11/12/2017 Katharine Look Rivard) Bilateral ureteral catheterizations and closure of cystostomy 11/12/2017 Elta Guadeloupe McKittrick) JP drain in place, removed 7/2 Foley in place, will continue F/u with urology in 2 weeks F/u with gynecology as below Appreciate input  Hemorrhagic shock/ Thrombocytopenia S/p transfusion 6 units prbc (6/27) 3 units FFP (6/27) 1 unit platelet (6/27) 1 unit cryoprecipitate (6/27) 2 units platelet (6/27) 1 unit PRBC (6/28) 2 units PRBC (7/1) Please repeat cbc in 2 weeks by urology or gynecology  Hypokalemia, Hypomagnesemia, hypophosphatemia S/p repletion Check cmp, magnesium, phos in 2 weeks by urology or gynecology  Hyperglycemia Monitor  Severe protein calorie malnutrition prostat 30 mL po bid     Admission Diagnosis  Large Symptomatic   Discharge Diagnosis  Large Symptomatic   Active Problems:   Hemorrhagic shock ([facility])   Fibroid, uterine   Severe protein-calorie malnutrition ([place])      Past Medical History:  Diagnosis Date  . Anemia   . Asthma    rarely uses inhaler -uses when patient gets sick  . Seasonal allergies   . SVD (spontaneous vaginal delivery)    x 2    Past Surgical History:  Procedure Laterality Date  . HYSTERECTOMY ABDOMINAL WITH SALPINGECTOMY Bilateral 11/12/2017   Procedure: HYSTERECTOMY ABDOMINAL WITH BILATERAL SALPINGECTOMY WITH REPAIR OF RETROPERIOTONEAL VENOUS BLEED (REPAIRED BY DR. Harrell Gave DICKSON FROM VASCULAR)( IN AT 11:25 OUT 11:50);  Surgeon: Delsa Bern, MD;  Location: [facility];  Service: Gynecology;  Laterality: Bilateral;  .  TOOTH EXTRACTION    . WISDOM TOOTH EXTRACTION         HPI  from the history and physical done on the day of admission:     42 y.o.female P: 2-0-0-2 who presents for hysterectomy because of very large symptomatic uterine fibroids and menorrhagia. For over 10 years the patient has had enlarging fibroids and describes a menstrual flow lasting for 5 days. During that flow she will change her pad every 1.5 hours and occasionally, more often, due to clots . Pelvic cramping, rated 10/10 on a 10 point pain scale, accompanies her period, but will be relieved to 5/10 with Ibuprofen 800 mg. She goes on to report urinary frequency due to the pressure of her fibroids, occasional inter-menstrual spotting but denies dyspareunia or constipation. A pelvic ultrasound , May 2018 revealed: anteverted uterus ( 28 cm from fundus to external os) 28.61 x 14.65 x 20.78 cm, pedunculated lower uterine quadrant fibroid-8.27 cm; left ovary-4.94 cm and right ovary-not seen. An endometrial biopsy was not able to be performed on this patient secondary to uterine enlargement displacing her cervix out of view. A CBC and CMET in January 2018 by her primary care physician was  within normal limits. Hemoglobin at that time was 11.4. A review of both medical and cervical management options were given to the patient however, she has decided to proceed with an abdominal hysterectomy for definitive management.  TAH and bilateral salpingetomy by Delsa Bern 11/12/2017  Bilateral ureteral catheterizations and closure of cystostomy 11/12/2017 Donia Ast  Surgical course complicted by heomrrhage from varicose veins in the pelvis. Transfused 6 uints of PRBC and 3 units FFP and 1 unit platelets and cryopreceiptate.   Transferred to [facility] 11/12/2017 to PCCM care for hemorrhagic shock due to complications of fibroid surgery  Transfused 2units platelets 11/12/2017 by pccm Magnesium and potassium and phosphorus  repleted 11/12/2017.   Transfused 1 unit prbc on 11/13/2017 Extubated on 11/13/2017  Transferred [facility] on 11/14/2017       Hospital Course:     Pt was admitted to [facility].  Pt started on prostat for severe protein calorie malnutrition.  Pt states pain controlled with oxycodone.  Pt had hgb 7.3, and was transfused with 2 units prbc with improvement in Hgb to 9.4.  Urine continued to clear and have less blood.  JP drain removed on 7/2.  Pt is tolerating regular diet and having BM.  Pt states feels good and is ready to be discharged to home.  Pt has follow up with urology in 2 weeks and gynecology as well.     Follow UP  Follow-up Information    Call Kathie Rhodes, MD.   Specialty:  Urology Why:  For an appointment in 2 weeks when you get home. Contact information: [place] [address] [phone]        Delsa Bern, MD Follow up on 12/22/2017.   Specialty:  Obstetrics and Gynecology Why:  appointment time is 10:30 a.m. Contact information: [place] [address] [phone]            Consults obtained - urology, gynecology  Discharge Condition: stable  Diet and Activity recommendation: See Discharge Instructions below  Discharge Instructions         Discharge Medications     Allergies as of 11/17/2017      Reactions   Shellfish Allergy Hives, Itching      Medication List    TAKE these medications   acetaminophen 500 MG tablet Commonly known as:  TYLENOL Take 500 mg by mouth every 6 (six) hours as needed for mild pain or headache.   albuterol 108 (90 Base) MCG/ACT inhaler Commonly known as:  PROVENTIL HFA;VENTOLIN HFA Inhale 1-2 puffs into the lungs every 6 (six) hours as needed for wheezing or shortness of breath.   bacitracin ointment Apply topically 2 (two) times daily.   diphenhydrAMINE-zinc acetate cream Commonly known as:  BENADRYL Apply topically 3 (three) times daily as needed for itching.     docusate sodium 100 MG capsule Commonly known as:  COLACE Take 1 capsule (100 mg total) by mouth 2 (two) times daily.   feeding supplement (PRO-STAT SUGAR FREE 64) Liqd Take 30 mLs by mouth 2 (two) times daily.   lidocaine 2 % jelly Commonly known as:  XYLOCAINE Place 1 application into the urethra 3 (three) times daily as needed (Urethral discomfort).   mirabegron ER 25 MG Tb24 tablet Commonly known as:  MYRBETRIQ Take 1 tablet (25 mg total) by mouth daily.   ondansetron 4 MG tablet Commonly known as:  ZOFRAN Take 1 tablet (4 mg total) by mouth every 8 (eight) hours as needed for  nausea or vomiting.   oxyCODONE-acetaminophen 5-325 MG tablet Commonly known as:  PERCOCET/ROXICET Take 1-2 tablets by mouth every 6 (six) hours as needed for moderate pain.   sulfamethoxazole-trimethoprim 800-160 MG tablet Commonly known as:  BACTRIM DS,SEPTRA DS Take 1 tablet by mouth every 12 (twelve) hours.       Major procedures and Radiology Reports - PLEASE review detailed and final reports for all details, in brief -       Dg Chest Port 1 View  Result Date: 11/12/2017 CLINICAL DATA:  Ett placed EXAM: PORTABLE CHEST 1 VIEW COMPARISON:  None. FINDINGS: An endotracheal tube is in place, tip approximately 1.7 centimeters above the carina. Heart size is normal. There are no focal consolidations or pleural effusions. No pulmonary edema. IMPRESSION: Two endotracheal tube in place, 1.7 centimeters above carina. Electronically Signed   By: Nolon Nations M.D.   On: 11/12/2017 15:52   Dg Abd Portable 1v  Result Date: 11/12/2017 CLINICAL DATA:  Evaluate OG tube EXAM: PORTABLE ABDOMEN - 1 VIEW COMPARISON:  None. FINDINGS: The OG tube terminates in the distal stomach. There is some sort a catheter or drain in the pelvis. IMPRESSION: 1. The distal tip of the OG tube is in the distal stomach. 2. Some sort a catheter or drain is seen in the pelvis. Electronically Signed   By: Dorise Bullion III M.D    On: 11/12/2017 16:06    Micro Results     Recent Results (from the past 240 hour(s))  MRSA PCR Screening     Status: None   Collection Time: 11/12/17  3:10 PM  Result Value Ref Range Status   MRSA by PCR NEGATIVE NEGATIVE Final    Comment:        The GeneXpert MRSA Assay (FDA approved for NASAL specimens only), is one component of a comprehensive MRSA colonization surveillance program. It is not intended to diagnose MRSA infection nor to guide or monitor treatment for MRSA infections. Performed at [facility], [place] [address], [place], [place] [address]        Today   Subjective    Eveleen Mcnear today is doing well, afebrile. Urine is more clear yellow.  Eating well.   No headache,no chest pain, no abdominal pain,no new weakness tingling or numbness, feels much better wants to go home today.   Objective   Blood pressure 140/83, pulse (!) 57, temperature 98.2 F (36.8 C), temperature source Oral, resp. rate 16, height 5' 4.02" (1.626 m), weight 81.6 kg (179 lb 14.3 oz), last menstrual period 10/31/2017, SpO2 100 %.   Intake/Output Summary (Last 24 hours) at 11/17/2017 1040 Last data filed at 11/17/2017 0830 Gross per 24 hour  Intake 1772.5 ml  Output 3861 ml  Net -2088.5 ml    Exam Awake Alert, Oriented x 3, No new F.N deficits, Normal affect [place].AT,PERRAL Supple Neck,No JVD, No cervical lymphadenopathy appriciated.  Symmetrical Chest wall movement, Good air movement bilaterally, CTAB RRR,No Gallops,Rubs or new Murmurs, No Parasternal Heave +ve B.Sounds, Abd Soft, Non tender, No organomegaly appriciated, No rebound -guarding or rigidity. No Cyanosis, Clubbing or edema, No new Rash or bruise JP out Foley in place, clear yellow urine   Data Review   CBC w Diff:  Lab Results  Component Value Date   WBC 5.0 11/17/2017   HGB 9.4 (L) 11/17/2017   HCT 28.2 (L) 11/17/2017   PLT 134 (L) 11/17/2017    CMP:  Lab Results  Component Value Date  NA 139  11/17/2017   K 3.7 11/17/2017   CL 108 11/17/2017   CO2 26 11/17/2017   BUN <5 (L) 11/17/2017   CREATININE 0.92 11/17/2017   PROT 4.9 (L) 11/17/2017   ALBUMIN 2.3 (L) 11/17/2017   BILITOT 0.5 11/17/2017   ALKPHOS 60 11/17/2017   AST 21 11/17/2017   ALT 16 11/17/2017  .   Total Time in preparing paper work, data evaluation and todays exam - 24 minutes  Jani Gravel M.D on 11/17/2017 at 10:40 AM  [facility]   Office  [phone]

## 2017-11-17 NOTE — Progress Notes (Signed)
Patient ID: Kathleen Cole, female   DOB: 24-Sep-1975, 42 y.o.   MRN: [id number]    Assessment:    Cystotomy closure: Patient continued to do well.  Foley catheter is draining appropriately and JP drain output is decreasing.  Drain fluid sent for creatinine and found to be consistent with serum.    Plan:   1.  DC JP drain. 2.  She will follow up with me as an outpatient in 2 weeks for cystogram.   Subjective: No complaints reported.  Objective: Vital signs in last 24 hours: Temp:  [98 F (36.7 C)-98.7 F (37.1 C)] 98.2 F (36.8 C) (07/02 0443) Pulse Rate:  [57-84] 57 (07/02 0443) Resp:  [16-18] 16 (07/02 0443) BP: (121-141)/(67-93) 140/83 (07/02 0443) SpO2:  [95 %-100 %] 100 % (07/02 0443)A  Intake/Output from previous day: 07/01 0701 - 07/02 0700 In: 2012.5 [P.O.:720; I.V.:250; Blood:742.5] Out: 4496 [Urine:3700; Stool:1] Intake/Output this shift: No intake/output data recorded.  Past Medical History:  Diagnosis Date  . Anemia   . Asthma    rarely uses inhaler -uses when patient gets sick  . Seasonal allergies   . SVD (spontaneous vaginal delivery)    x 2    Physical Exam:  General: Awake, alert and in no apparent distress Lungs: Normal respiratory effort, chest expands symmetrically.  Abdomen: Soft, non-tender & non-distended. Dressing dry and intact  Lab Results: Recent Labs    11/14/17 0810 11/15/17 0824 11/16/17 0627  WBC 13.8* 9.8 6.0  HGB 8.5* 7.3* 7.3*  HCT 24.5* 21.3* 21.3*   BMET Recent Labs    11/15/17 0824 11/16/17 0627  NA 138 139  K 3.5 3.3*  CL 108 107  CO2 25 23  GLUCOSE 105* 94  BUN <5* 5*  CREATININE 1.00 0.91  CALCIUM 7.4* 7.6*   No results for input(s): LABURIN in the last 72 hours. Results for orders placed or performed during the hospital encounter of 11/12/17  MRSA PCR Screening     Status: None   Collection Time: 11/12/17  3:10 PM  Result Value Ref Range Status   MRSA by PCR NEGATIVE NEGATIVE Final    Comment:          The GeneXpert MRSA Assay (FDA approved for NASAL specimens only), is one component of a comprehensive MRSA colonization surveillance program. It is not intended to diagnose MRSA infection nor to guide or monitor treatment for MRSA infections. Performed at [facility], [place] [address], [place], [place] [address]     Studies/Results: No results found.    Eliani Leclere C 11/17/2017, 7:19 AM

## 2017-11-17 NOTE — Progress Notes (Signed)
Patient given discharge instructions. Patient and family educated on catheter care and infection prevention. Catheter bag changed, catheter clean, dry and intact with securing device.Patient aware of follow up appointments. Patient left unit in stable condition via wheelchair.

## 2017-11-17 NOTE — Progress Notes (Addendum)
Kathleen Cole is a59 y.o.  [id number]  Post Op Date #5: Total Abdominal Hysterectomy, Bilateral Salpingectomy, Repair of Cystotomy, Cannulation of Bilateral Ureters and Repair of Retroperitoneal Bleed and Massive Transfusion Protocol    2  Subjective: Patient is Doing well postoperatively. Patient continues to improve.  Hasn't ambulated since receiving #2 units of PRBC last evening. Patient has Pain is controlled with current analgesics. Medications being used: narcotic analgesics including Percocet 5/325. Patient reports small relief from Myrbetriq with spasms associated with bladder irrigation. Continues regular diet, to pass flatus  and will resume ambulation today.     Objective: Vital signs in last 24 hours: Temp:  [98 F (36.7 C)-98.7 F (37.1 C)] 98.2 F (36.8 C) (07/02 0443) Pulse Rate:  [57-84] 57 (07/02 0443) Resp:  [16-18] 16 (07/02 0443) BP: (121-141)/(67-93) 140/83 (07/02 0443) SpO2:  [95 %-100 %] 100 % (07/02 0443)  Intake/Output from previous day: 07/01 0701 - 07/02 0700 In: 2012.5 [P.O.:720; I.V.:250] Out: 3701 [Urine:3700] Intake/Output this shift: No intake/output data recorded. Recent Labs  Lab 11/15/17 0824 11/16/17 0627 11/17/17 0600  WBC 9.8 6.0 5.0  HGB 7.3* 7.3* 9.4*  HCT 21.3* 21.3* 28.2*  PLT 95* 122* 134*     Recent Labs  Lab 11/15/17 0824 11/16/17 0627 11/17/17 0600  NA 138 139 139  K 3.5 3.3* 3.7  CL 108 107 108  CO2 25 23 26   BUN <5* 5* <5*  CREATININE 1.00 0.91 0.92  CALCIUM 7.4* 7.6* 7.9*  PROT 4.6* 4.9* 4.9*  BILITOT 0.5 0.5 0.5  ALKPHOS 57 63 60  ALT 15 15 16   AST 31 23 21   GLUCOSE 105* 94 94    EXAM: General: alert, cooperative and no distress Resp: clear to auscultation bilaterally Cardio: regular rate and rhythm, S1, S2 normal, no murmur, click, rub or gallop GI: soft, incision dressing is intact, dry with dried stains and no evidence of infectiion. Extremities: Homans sign is negative, no sign of DVT and no  calf tenderness.   Assessment: s/p Procedure(s): HYSTERECTOMY ABDOMINAL WITH BILATERAL SALPINGECTOMY WITH REPAIR OF RETROPERIOTONEAL VENOUS BLEED (REPAIRED BY DR. Harrell Gave DICKSON FROM VASCULAR)( IN AT 11:25 OUT 11:50) URETERAL CATHETER  PLACEMENT BILATERAL, CLOSURE CYSTOTOMY: stable, progressing well, tolerating diet and anemia  Plan: Encourage ambulation Routine care  Awaiting labs (creatinine testing)  from JP drain specimen. Patient will need to be taught Foley care prior to discharge home   LOS: 5 days    Earnstine Regal, PA-C 11/17/2017 7:29 AM I saw and examined patient at bedside and agree with above findings, assessment and plan per PA [facility].  Discussed incision care, and office follow up. Dr. Alesia Richards.

## 2017-11-21 LAB — BPAM RBC
BLOOD PRODUCT EXPIRATION DATE: 201907252359
BLOOD PRODUCT EXPIRATION DATE: 201907282359
BLOOD PRODUCT EXPIRATION DATE: 201908022359
Blood Product Expiration Date: 201907142359
Blood Product Expiration Date: 201907192359
Blood Product Expiration Date: 201907232359
Blood Product Expiration Date: 201907252359
Blood Product Expiration Date: 201907252359
Blood Product Expiration Date: 201907252359
Blood Product Expiration Date: 201907272359
Blood Product Expiration Date: 201907282359
Blood Product Expiration Date: 201908022359
ISSUE DATE / TIME: 201906251406
ISSUE DATE / TIME: 201906251406
ISSUE DATE / TIME: 201906270923
ISSUE DATE / TIME: 201906270923
ISSUE DATE / TIME: 201906270940
ISSUE DATE / TIME: 201906270940
ISSUE DATE / TIME: 201906271033
ISSUE DATE / TIME: 201906271033
ISSUE DATE / TIME: 201906271126
ISSUE DATE / TIME: 201906271126
ISSUE DATE / TIME: 201906300701
UNIT TYPE AND RH: 5100
UNIT TYPE AND RH: 5100
UNIT TYPE AND RH: 9500
UNIT TYPE AND RH: 9500
Unit Type and Rh: 5100
Unit Type and Rh: 5100
Unit Type and Rh: 5100
Unit Type and Rh: 5100
Unit Type and Rh: 5100
Unit Type and Rh: 5100
Unit Type and Rh: 9500
Unit Type and Rh: 9500

## 2017-11-21 LAB — TYPE AND SCREEN
ABO/RH(D): O POS
Antibody Screen: NEGATIVE
UNIT DIVISION: 0
UNIT DIVISION: 0
UNIT DIVISION: 0
UNIT DIVISION: 0
UNIT DIVISION: 0
UNIT DIVISION: 0
UNIT DIVISION: 0
UNIT DIVISION: 0
Unit division: 0
Unit division: 0
Unit division: 0
Unit division: 0

## 2017-12-02 DIAGNOSIS — S3729XD Other injury of bladder, subsequent encounter: Secondary | ICD-10-CM | POA: Diagnosis not present

## 2017-12-05 DIAGNOSIS — D649 Anemia, unspecified: Secondary | ICD-10-CM | POA: Diagnosis not present

## 2017-12-05 DIAGNOSIS — M79605 Pain in left leg: Secondary | ICD-10-CM | POA: Diagnosis not present

## 2017-12-05 DIAGNOSIS — E878 Other disorders of electrolyte and fluid balance, not elsewhere classified: Secondary | ICD-10-CM | POA: Diagnosis not present

## 2017-12-17 ENCOUNTER — Emergency Department (HOSPITAL_COMMUNITY)
Admission: EM | Admit: 2017-12-17 | Discharge: 2017-12-17 | Disposition: A | Discharge status: Home / Self Care | Payer: BLUE CROSS/BLUE SHIELD | Attending: Emergency Medicine | Admitting: Emergency Medicine

## 2017-12-17 ENCOUNTER — Encounter (HOSPITAL_COMMUNITY): Payer: BLUE CROSS/BLUE SHIELD

## 2017-12-17 ENCOUNTER — Encounter (HOSPITAL_COMMUNITY): Payer: Self-pay | Admitting: Emergency Medicine

## 2017-12-17 DIAGNOSIS — J45909 Unspecified asthma, uncomplicated: Secondary | ICD-10-CM | POA: Insufficient documentation

## 2017-12-17 DIAGNOSIS — I82412 Acute embolism and thrombosis of left femoral vein: Secondary | ICD-10-CM | POA: Diagnosis not present

## 2017-12-17 DIAGNOSIS — Z79899 Other long term (current) drug therapy: Secondary | ICD-10-CM | POA: Insufficient documentation

## 2017-12-17 DIAGNOSIS — I82402 Acute embolism and thrombosis of unspecified deep veins of left lower extremity: Secondary | ICD-10-CM | POA: Diagnosis not present

## 2017-12-17 DIAGNOSIS — R2242 Localized swelling, mass and lump, left lower limb: Secondary | ICD-10-CM | POA: Diagnosis not present

## 2017-12-17 DIAGNOSIS — I82432 Acute embolism and thrombosis of left popliteal vein: Secondary | ICD-10-CM | POA: Diagnosis not present

## 2017-12-17 DIAGNOSIS — N8302 Follicular cyst of left ovary: Secondary | ICD-10-CM | POA: Insufficient documentation

## 2017-12-17 DIAGNOSIS — I82422 Acute embolism and thrombosis of left iliac vein: Secondary | ICD-10-CM | POA: Diagnosis not present

## 2017-12-17 LAB — CBC WITH DIFFERENTIAL/PLATELET
Abs Immature Granulocytes: 0 10*3/uL (ref 0.0–0.1)
BASOS PCT: 1 %
Basophils Absolute: 0.1 10*3/uL (ref 0.0–0.1)
EOS ABS: 0.2 10*3/uL (ref 0.0–0.7)
Eosinophils Relative: 3 %
HCT: 37.5 % (ref 36.0–46.0)
Hemoglobin: 12.1 g/dL (ref 12.0–15.0)
IMMATURE GRANULOCYTES: 0 %
Lymphocytes Relative: 23 %
Lymphs Abs: 1.6 10*3/uL (ref 0.7–4.0)
MCH: 26.5 pg (ref 26.0–34.0)
MCHC: 32.3 g/dL (ref 30.0–36.0)
MCV: 82.2 fL (ref 78.0–100.0)
MONO ABS: 0.5 10*3/uL (ref 0.1–1.0)
MONOS PCT: 7 %
NEUTROS PCT: 66 %
Neutro Abs: 4.7 10*3/uL (ref 1.7–7.7)
PLATELETS: 257 10*3/uL (ref 150–400)
RBC: 4.56 MIL/uL (ref 3.87–5.11)
RDW: 14 % (ref 11.5–15.5)
WBC: 7.1 10*3/uL (ref 4.0–10.5)

## 2017-12-17 LAB — BASIC METABOLIC PANEL
Anion gap: 8 (ref 5–15)
BUN: 8 mg/dL (ref 6–20)
CALCIUM: 9 mg/dL (ref 8.9–10.3)
CO2: 23 mmol/L (ref 22–32)
CREATININE: 0.8 mg/dL (ref 0.44–1.00)
Chloride: 107 mmol/L (ref 98–111)
GFR calc non Af Amer: >60 mL/min (ref >60)
Glucose, Bld: 86 mg/dL (ref 70–99)
Potassium: 3.9 mmol/L (ref 3.5–5.1)
SODIUM: 138 mmol/L (ref 135–145)

## 2017-12-17 LAB — PROTIME-INR
INR: 1.19
Prothrombin Time: 15 seconds (ref 11.4–15.2)

## 2017-12-17 MED ORDER — RIVAROXABAN 15 MG PO TABS: 15.0000 mg | ORAL_TABLET | Freq: Two times a day (BID) | ORAL | Status: DC

## 2017-12-17 MED ORDER — RIVAROXABAN (XARELTO) EDUCATION KIT FOR DVT/PE PATIENTS
PACK | Freq: Once | Status: AC
  Administered 2017-12-17: 17:00:00
  Filled 2017-12-17: qty 1

## 2017-12-17 MED ORDER — RIVAROXABAN (XARELTO) VTE STARTER PACK (15 & 20 MG): ORAL_TABLET | ORAL | 0 refills | Status: DC

## 2017-12-17 MED ORDER — RIVAROXABAN 15 MG PO TABS
15.0000 mg | ORAL_TABLET | Freq: Once | ORAL | Status: AC
  Administered 2017-12-17: 15 mg via ORAL
  Filled 2017-12-17: qty 1

## 2017-12-17 MED ORDER — RIVAROXABAN 20 MG PO TABS: 20.0000 mg | ORAL_TABLET | Freq: Every day | ORAL | Status: DC

## 2017-12-17 NOTE — ED Triage Notes (Addendum)
Pt has a confirmed DVT in left entire leg, pt is 1 month post op from Hysterectomy. Pt has pain in left leg. Pt denies any CP or SOB.

## 2017-12-17 NOTE — Progress Notes (Signed)
ANTICOAGULATION CONSULT NOTE - Initial Consult  Pharmacy Consult:  Xarelto Indication: DVT  Allergies  Allergen Reactions  . Shellfish Allergy Hives and Itching    Patient Measurements: Weight: 160 lb (72.6 kg)  Vital Signs: Temp: 98.4 F (36.9 C) (08/01 1024) Temp Source: Oral (08/01 1024) BP: 118/82 (08/01 1330) Pulse Rate: 60 (08/01 1330)  Labs: Recent Labs    12/17/17 1352 12/17/17 1507  HGB 12.1  --   HCT 37.5  --   PLT 257  --   LABPROT  --  15.0  INR  --  1.19  CREATININE 0.80  --     Estimated Creatinine Clearance: 88.6 mL/min (by C-G formula based on SCr of 0.8 mg/dL).   Medical History: Past Medical History:  Diagnosis Date  . Anemia   . Asthma    rarely uses inhaler -uses when patient gets sick  . Seasonal allergies   . SVD (spontaneous vaginal delivery)    x 2     Assessment: 34 YOF presented with LLE swelling for 2 weeks.  She is s/p hysterectomy on 11/12/17 and had hemorrhagic shock/thrombocytopenia post-op.  Pharmacy has been consulted to dose Xarelto for DVT.  SCr 0.8, CrCL 89 ml/min, CBC WNL - no bleeding/thrombocytopenia currently.   Goal of Therapy:  Appropriate anticoagulation Monitor platelets by anticoagulation protocol: Yes    Plan:  Xarelto 15mg  PO BIDWM x 21 days, then on 01/07/18 start 20mg  PO daily with supper Xarelto education provided to patient and family   Lelend Heinecke D. Mina Marble, PharmD, BCPS, [place] 12/17/2017, 5:08 PM

## 2017-12-17 NOTE — ED Provider Notes (Signed)
[facility] EMERGENCY DEPARTMENT Provider Note   CSN: [id number] Arrival date & time: 12/17/17  1015     History   Chief Complaint Chief Complaint  Patient presents with  . Leg Swelling    HPI Kathleen Cole is a 42 y.o. female.  HPI  Pt was seen at 1345.  Per pt, c/o gradual onset and persistence of constant LLE "swelling" for the past 2 weeks. Pt states she was evaluated at [facility] 2 weeks ago, told her d-dimer was elevated, then sent to outpatient imaging for Vasc US. Pt was told she had DVT LLE and to go to the ED for further evaluation. Pt is s/p hysterectomy on 06/17/84, complicated by cystostomy, hemorrhagic shock/thrombocytopenia. Pt denies any bleeding issues, states her "blood counts were OK" at [facility] visit 2 weeks ago. Pt denies injury, no fevers, no rash, no focal motor weakness, no tingling/numbness in extremities, no abd pain, no back pain.       Past Medical History:  Diagnosis Date  . Anemia   . Asthma    rarely uses inhaler -uses when patient gets sick  . Seasonal allergies   . SVD (spontaneous vaginal delivery)    x 2    Patient Active Problem List   Diagnosis Date Noted  . Severe protein-calorie malnutrition ([place]) 11/15/2017  . Fibroid, uterine 11/14/2017  . Hemorrhagic shock ([place]) 11/12/2017    Past Surgical History:  Procedure Laterality Date  . HYSTERECTOMY ABDOMINAL WITH SALPINGECTOMY Bilateral 11/12/2017   Procedure: HYSTERECTOMY ABDOMINAL WITH BILATERAL SALPINGECTOMY WITH REPAIR OF RETROPERIOTONEAL VENOUS BLEED (REPAIRED BY DR. Harrell Gave DICKSON FROM VASCULAR)( IN AT 11:25 OUT 11:50);  Surgeon: Delsa Bern, MD;  Location: [facility];  Service: Gynecology;  Laterality: Bilateral;  . TOOTH EXTRACTION    . WISDOM TOOTH EXTRACTION       OB History   None      Home Medications    Prior to Admission medications   Medication Sig Start Date End Date Taking? Authorizing Provider  acetaminophen (TYLENOL) 500 MG  tablet Take 500 mg by mouth every 6 (six) hours as needed for mild pain or headache.   Yes [provider]  albuterol (PROVENTIL HFA;VENTOLIN HFA) 108 (90 Base) MCG/ACT inhaler Inhale 1-2 puffs into the lungs every 6 (six) hours as needed for wheezing or shortness of breath.   Yes [provider]  oxyCODONE-acetaminophen (PERCOCET/ROXICET) 5-325 MG tablet Take 1-2 tablets by mouth every 6 (six) hours as needed for moderate pain. 11/17/17  Yes Jani Gravel, MD  Amino Acids-Protein Hydrolys (FEEDING SUPPLEMENT, PRO-STAT SUGAR FREE 64,) LIQD Take 30 mLs by mouth 2 (two) times daily. Patient not taking: Reported on 12/17/2017 11/17/17   Jani Gravel, MD  docusate sodium (COLACE) 100 MG capsule Take 1 capsule (100 mg total) by mouth 2 (two) times daily. Patient not taking: Reported on 12/17/2017 11/17/17   Jani Gravel, MD  mirabegron ER (MYRBETRIQ) 25 MG TB24 tablet Take 1 tablet (25 mg total) by mouth daily. Patient not taking: Reported on 12/17/2017 11/17/17   Jani Gravel, MD  sulfamethoxazole-trimethoprim (BACTRIM DS,SEPTRA DS) 800-160 MG tablet Take 1 tablet by mouth every 12 (twelve) hours. Patient not taking: Reported on 12/17/2017 11/17/17   Jani Gravel, MD    Family History No family history on file.  Social History Social History   Tobacco Use  . Smoking status: Never Smoker  . Smokeless tobacco: Never Used  Substance Use Topics  . Alcohol use: Yes  Comment: occasional wine  . Drug use: Never     Allergies   Shellfish allergy   Review of Systems Review of Systems ROS: Statement: All systems negative except as marked or noted in the HPI; Constitutional: Negative for fever and chills. ; ; Eyes: Negative for eye pain, redness and discharge. ; ; ENMT: Negative for ear pain, hoarseness, nasal congestion, sinus pressure and sore throat. ; ; Cardiovascular: Negative for chest pain, palpitations, diaphoresis, dyspnea and peripheral edema. ; ; Respiratory: Negative for cough, wheezing and  stridor. ; ; Gastrointestinal: Negative for nausea, vomiting, diarrhea, abdominal pain, blood in stool, hematemesis, jaundice and rectal bleeding. . ; ; Genitourinary: Negative for dysuria, flank pain and hematuria. ; ; Musculoskeletal: Negative for back pain and neck pain.  +LLE swelling.; ; Skin: Negative for pruritus, rash, abrasions, blisters, bruising and skin lesion.; ; Neuro: Negative for headache, lightheadedness and neck stiffness. Negative for weakness, altered level of consciousness, altered mental status, extremity weakness, paresthesias, involuntary movement, seizure and syncope.       Physical Exam Updated Vital Signs BP 118/82   Pulse 60   Temp 98.4 F (36.9 C) (Oral)   Resp 16   LMP 10/31/2017 (Approximate)   SpO2 100%   Physical Exam 1350: Physical examination:  Nursing notes reviewed; Vital signs and O2 SAT reviewed;  Constitutional: Well developed, Well nourished, Well hydrated, In no acute distress; Head:  Normocephalic, atraumatic; Eyes: EOMI, PERRL, No scleral icterus; ENMT: Mouth and pharynx normal, Mucous membranes moist; Neck: Supple, Full range of motion, No lymphadenopathy; Cardiovascular: Regular rate and rhythm, No gallop; Respiratory: Breath sounds clear & equal bilaterally, No wheezes.  Speaking full sentences with ease, Normal respiratory effort/excursion; Chest: Nontender, Movement normal; Abdomen: Soft, Nontender, Nondistended, Normal bowel sounds; Genitourinary: No CVA tenderness; Extremities: Peripheral pulses normal, No tenderness, +2 LLE edema ankle to thigh with calf asymmetry. Palp pedal pulses, muscles compartments NT/soft, no rash, LLE warm/dry/good color..; Neuro: AA&Ox3, Major CN grossly intact.  Speech clear. No gross focal motor or sensory deficits in extremities.; Skin: Color normal, Warm, Dry.   ED Treatments / Results  Labs (all labs ordered are listed, but only abnormal results are  displayed)   EKG None  Radiology   Procedures Procedures (including critical care time)  Medications Ordered in ED Medications - No data to display   Initial Impression / Assessment and Plan / ED Course  I have reviewed the triage vital signs and the nursing notes.  Pertinent labs & imaging results that were available during my care of the patient were reviewed by me and considered in my medical decision making (see chart for details).  MDM Reviewed: previous chart, nursing note and vitals Reviewed previous: labs and ultrasound Interpretation: labs       Result Impression  IMPRESSION:  1.Extensive deep venous thrombosis throughout the entire left leg extending up into the left iliac vein. The superior extent of the thrombus is not visualized. 2.No evidence of deep venous thrombosis in the right leg. 3.2 hemorrhagic cysts in the left ovary.     These results were communicated directly to Dr. Shanon Rosser at 9:50AM. After discussion with Dr. Laverta Baltimore, the patient was sent to the emergency room.  Electronically Signed by: Ross Marcus  Result Narrative  TECHNIQUE: The veins of both lower extremities were interrogated from the visible common femoral vein to the distal popliteal vein.The junction of the greater saphenous with the common femoral vein as well as the posterior tibial veins were evaluated.  Gray scale, color, spectral, and doppler sonography was utilized and analyzed.  COMPARISON: None. INDICATION: Pain in left leg,  FINDINGS: There is extensive thrombus in the left leg beginning in the popliteal vein and extending up through the femoral vein and common femoral vein into the pelvis into the left iliac vein. This appears to be occlusive thrombus. No flow is noted around the thrombus.    The right common femoral vein, femoral vein, popliteal vein, and posterior tibial vein all demonstrate normal spontaneous venous flow with good respiratory variation.  All of the veins in the right leg are compressible with compression techniques. There is no evidence of deep venous thrombosis in the right leg.  INCIDENTAL FINDINGS:Incidental note is made of 2 cyst in the left ovary measuring 2.7 and 2.2 cm respectively. These appear to be hemorrhagic cyst as they contain internal echoes.  Other Result Information  Acute Interface, Incoming Rad Results - 12/17/2017  9:59 AM EDT TECHNIQUE: The veins of both lower extremities were interrogated from the visible common femoral vein to the distal popliteal vein.  The junction of the greater saphenous with the common femoral vein as well as the posterior tibial veins were evaluated.   Gray scale, color, spectral, and doppler sonography was utilized and analyzed.  COMPARISON: None.   INDICATION: Pain in left leg,  FINDINGS: There is extensive thrombus in the left leg beginning in the popliteal vein and extending up through the femoral vein and common femoral vein into the pelvis into the left iliac vein. This appears to be occlusive thrombus. No flow is noted  around the thrombus.    The right common femoral vein, femoral vein, popliteal vein, and posterior tibial vein all demonstrate normal spontaneous venous flow with good respiratory variation. All of the veins in the right leg are compressible with compression techniques. There  is no evidence of deep venous thrombosis in the right leg.  INCIDENTAL FINDINGS:  Incidental note is made of 2 cyst in the left ovary measuring 2.7 and 2.2 cm respectively. These appear to be hemorrhagic cyst as they contain internal echoes.   IMPRESSION:  1.  Extensive deep venous thrombosis throughout the entire left leg extending up into the left iliac vein. The superior extent of the thrombus is not visualized. 2.  No evidence of deep venous thrombosis in the right leg. 3.  2 hemorrhagic cysts in the left ovary.    These results were communicated directly to Dr. Shanon Rosser at  9:50  AM. After discussion with Dr. Laverta Baltimore, the patient was sent to the emergency room.  Electronically Signed by: Ross Marcus   Results for orders placed or performed during the hospital encounter of 12/87/86  Basic metabolic panel  Result Value Ref Range   Sodium 138 135 - 145 mmol/L   Potassium 3.9 3.5 - 5.1 mmol/L   Chloride 107 98 - 111 mmol/L   CO2 23 22 - 32 mmol/L   Glucose, Bld 86 70 - 99 mg/dL   BUN 8 6 - 20 mg/dL   Creatinine, Ser 0.80 0.44 - 1.00 mg/dL   Calcium 9.0 8.9 - 10.3 mg/dL   GFR calc non Af Amer >60 >60 mL/min   GFR calc Af Amer >60 >60 mL/min   Anion gap 8 5 - 15  CBC with Differential  Result Value Ref Range   WBC 7.1 4.0 - 10.5 K/uL   RBC 4.56 3.87 - 5.11 MIL/uL   Hemoglobin 12.1 12.0 - 15.0 g/dL   HCT  37.5 36.0 - 46.0 %   MCV 82.2 78.0 - 100.0 fL   MCH 26.5 26.0 - 34.0 pg   MCHC 32.3 30.0 - 36.0 g/dL   RDW 14.0 11.5 - 15.5 %   Platelets 257 150 - 400 K/uL   Neutrophils Relative % 66 %   Neutro Abs 4.7 1.7 - 7.7 K/uL   Lymphocytes Relative 23 %   Lymphs Abs 1.6 0.7 - 4.0 K/uL   Monocytes Relative 7 %   Monocytes Absolute 0.5 0.1 - 1.0 K/uL   Eosinophils Relative 3 %   Eosinophils Absolute 0.2 0.0 - 0.7 K/uL   Basophils Relative 1 %   Basophils Absolute 0.1 0.0 - 0.1 K/uL   Immature Granulocytes 0 %   Abs Immature Granulocytes 0.0 0.0 - 0.1 K/uL  Protime-INR  Result Value Ref Range   Prothrombin Time 15.0 11.4 - 15.2 seconds   INR 1.19       1400:  Outpt vasc US as above. Labs pending. T/C returned from Vasc Dr. Bridgett Larsson, case discussed, including:  HPI, pertinent PM/SHx, VS/PE, dx testing, ED course and treatment:  Agrees with checking labs (H/H, BUN/Cr), OK to start xarelto since pt 1+ month out from surgery and if BUN/Cr OK, no acute surgical intervention needed at this time, no other acute imaging studies needed at this time, f/u PMD. Pt and family updated. Agreeable with plan.   1615:  Labs reassuring. Tx xarelto, f/u PMD. Dx and testing  d/w pt and family.  Questions answered.  Verb understanding, agreeable to d/c home with outpt f/u.        Final Clinical Impressions(s) / ED Diagnoses   Final diagnoses:  None    ED Discharge Orders    None       Francine Graven, DO 12/20/17 1517

## 2017-12-17 NOTE — Discharge Instructions (Signed)
Take the prescription as directed.  Call your regular medical doctor tomorrow to schedule a follow up appointment within the next 3 days.  Return to the Emergency Department immediately sooner if worsening.     Information on my medicine - XARELTO (rivaroxaban)  This medication education was reviewed with me or my healthcare representative as part of my discharge preparation.  The pharmacist that spoke with me during my hospital stay was:  Saundra Shelling, [place]? Xarelto was prescribed to treat blood clots that may have been found in the veins of your legs (deep vein thrombosis) or in your lungs (pulmonary embolism) and to reduce the risk of them occurring again.  What do you need to know about Xarelto? The starting dose is one 15 mg tablet taken TWICE daily with food for the FIRST 21 DAYS then on (enter date)  01/07/18  the dose is changed to one 20 mg tablet taken ONCE A DAY with your evening meal.  DO NOT stop taking Xarelto without talking to the health care provider who prescribed the medication.  Refill your prescription for 20 mg tablets before you run out.  After discharge, you should have regular check-up appointments with your healthcare provider that is prescribing your Xarelto.  In the future your dose may need to be changed if your kidney function changes by a significant amount.  What do you do if you miss a dose? If you are taking Xarelto TWICE DAILY and you miss a dose, take it as soon as you remember. You may take two 15 mg tablets (total 30 mg) at the same time then resume your regularly scheduled 15 mg twice daily the next day.  If you are taking Xarelto ONCE DAILY and you miss a dose, take it as soon as you remember on the same day then continue your regularly scheduled once daily regimen the next day. Do not take two doses of Xarelto at the same time.   Important Safety Information Xarelto is a blood thinner medicine that can  cause bleeding. You should call your healthcare provider right away if you experience any of the following: ? Bleeding from an injury or your nose that does not stop. ? Unusual colored urine (red or dark brown) or unusual colored stools (red or black). ? Unusual bruising for unknown reasons. ? A serious fall or if you hit your head (even if there is no bleeding).  Some medicines may interact with Xarelto and might increase your risk of bleeding while on Xarelto. To help avoid this, consult your healthcare provider or pharmacist prior to using any new prescription or non-prescription medications, including herbals, vitamins, non-steroidal anti-inflammatory drugs (NSAIDs) and supplements.  This website has more information on Xarelto: [email or web address]

## 2017-12-25 ENCOUNTER — Other Ambulatory Visit: Payer: Self-pay | Admitting: Obstetrics and Gynecology

## 2017-12-25 ENCOUNTER — Other Ambulatory Visit: Payer: Self-pay | Admitting: Cardiology

## 2017-12-25 DIAGNOSIS — Z90710 Acquired absence of both cervix and uterus: Secondary | ICD-10-CM

## 2017-12-31 ENCOUNTER — Ambulatory Visit
Admission: RE | Admit: 2017-12-31 | Discharge: 2017-12-31 | Disposition: A | Discharge status: Home / Self Care | Payer: BLUE CROSS/BLUE SHIELD | Source: Ambulatory Visit | Attending: Obstetrics and Gynecology | Admitting: Obstetrics and Gynecology

## 2017-12-31 DIAGNOSIS — K6811 Postprocedural retroperitoneal abscess: Secondary | ICD-10-CM | POA: Diagnosis not present

## 2017-12-31 DIAGNOSIS — Z90710 Acquired absence of both cervix and uterus: Secondary | ICD-10-CM

## 2017-12-31 MED ORDER — IOHEXOL 300 MG/ML  SOLN
25.0000 mL | Freq: Once | INTRAMUSCULAR | Status: AC | PRN
  Administered 2017-12-31: 25 mL via ORAL

## 2017-12-31 MED ORDER — IOHEXOL 180 MG/ML  SOLN: 20.0000 mL | Freq: Once | INTRAMUSCULAR | Status: DC | PRN

## 2017-12-31 MED ORDER — IOPAMIDOL (ISOVUE-300) INJECTION 61%
100.0000 mL | Freq: Once | INTRAVENOUS | Status: AC | PRN
  Administered 2017-12-31: 100 mL via INTRAVENOUS

## 2018-01-04 ENCOUNTER — Other Ambulatory Visit: Payer: Self-pay

## 2018-01-04 ENCOUNTER — Encounter: Payer: Self-pay | Admitting: Oncology

## 2018-01-04 ENCOUNTER — Ambulatory Visit (INDEPENDENT_AMBULATORY_CARE_PROVIDER_SITE_OTHER): Payer: BLUE CROSS/BLUE SHIELD | Admitting: Oncology

## 2018-01-04 VITALS — BP 126/80 | HR 55 | Temp 98.3°F | Ht 65.0 in | Wt 160.0 lb

## 2018-01-04 DIAGNOSIS — I824Y2 Acute embolism and thrombosis of unspecified deep veins of left proximal lower extremity: Secondary | ICD-10-CM

## 2018-01-04 DIAGNOSIS — Z91013 Allergy to seafood: Secondary | ICD-10-CM

## 2018-01-04 DIAGNOSIS — Z90710 Acquired absence of both cervix and uterus: Secondary | ICD-10-CM | POA: Diagnosis not present

## 2018-01-04 DIAGNOSIS — J45909 Unspecified asthma, uncomplicated: Secondary | ICD-10-CM | POA: Diagnosis not present

## 2018-01-04 DIAGNOSIS — Z90722 Acquired absence of ovaries, bilateral: Secondary | ICD-10-CM

## 2018-01-04 DIAGNOSIS — Z8742 Personal history of other diseases of the female genital tract: Secondary | ICD-10-CM

## 2018-01-04 DIAGNOSIS — Z7901 Long term (current) use of anticoagulants: Secondary | ICD-10-CM

## 2018-01-04 HISTORY — DX: Acute embolism and thrombosis of unspecified deep veins of left proximal lower extremity: I82.4Y2

## 2018-01-04 MED ORDER — RIVAROXABAN 20 MG PO TABS: 20.0000 mg | ORAL_TABLET | Freq: Every day | ORAL | 1 refills | Status: DC

## 2018-01-04 NOTE — Patient Instructions (Addendum)
Return visit 1st week of December Lab 1 week before visit on 11/25

## 2018-01-05 NOTE — Progress Notes (Signed)
New Patient Hematology   Kathleen Cole [id number] December 16, 1975 42 y.o. 01/05/2018  CC: Dr. Katharine Look Rivard   Reason for referral:  Extensive acute proximal left lower extremity DVT Advice on anticoagulation, need for any additional lab testing.  HPI:  Pleasant 42 year old woman who underwent an elective transabdominal hysterectomy with bilateral oophorectomy on November 12, 2017.  Operative findings were a massive retroperitoneal fibroid approximating the size of a 33-week uterus.  Uterus itself was small and normal-appearing.  Approximate 4 cm cystostomy occurred when trying to to dissect the bladder from the fibroid.  Bleeding encountered on the right pelvic wall from what appeared to be a pelvic varicose vein.  Vessel was clamped.  Subsequently ligated.  Hemostasis obtained and additional areas of bleeding treated with electrocautery and application of a topical hemostatic agent.  Urologist was called and assisted with closing the cystotomy. Both ureters were intact.  Patient did have a Foley catheter in place for 3 weeks postop. She presented on August 1 with pain and swelling of her left leg and was found to have extensive venous thrombosis beginning in the popliteal vein extending up into the left iliac vein.  She was started on anticoagulation with Xarelto. Subsequent CT scan of the pelvis done on August 15 shows a likely 4 x 3 cm seroma adjacent to the left iliac bifurcation with no mention that this is compressing the iliac vein.  No leakage of contrast seen in the ureters.  The patient has no prior history of clotting.  She has no prior major medical or surgical illness.  She has mild asthma that flares up on occasion.  She was on no chronic medications prior to the surgery.  She had no other surgeries in the past.  2 uncomplicated vaginal deliveries. No major risk factors for thrombosis other than the recent surgery.  Non-smoker.  BMI 26.6.  No signs or symptoms of a collagen vascular  disorder.  No hair loss, unusual skin rashes, easy bruisability, polymyalgia or polyarthralgia. Mother died at age 68 possibly related to sarcoidosis.  Father died in his 69s of pneumonia.  3 year old daughter who has not yet been pregnant.  19 year old son.  Both healthy.  PMH: Past Medical History:  Diagnosis Date  . Acute deep vein thrombosis (DVT) of proximal end of left lower extremity ([place]) 01/04/2018   12/17/17 provoked extensive proximal DVT 4-5 wks post Hysterectomy complicated by tear in bladder  . Anemia   . Asthma    rarely uses inhaler -uses when patient gets sick  . Seasonal allergies   . SVD (spontaneous vaginal delivery)    x 2    Past Surgical History:  Procedure Laterality Date  . HYSTERECTOMY ABDOMINAL WITH SALPINGECTOMY Bilateral 11/12/2017   Procedure: HYSTERECTOMY ABDOMINAL WITH BILATERAL SALPINGECTOMY WITH REPAIR OF RETROPERIOTONEAL VENOUS BLEED (REPAIRED BY DR. Harrell Gave DICKSON FROM VASCULAR)( IN AT 11:25 OUT 11:50);  Surgeon: Delsa Bern, MD;  Location: [facility];  Service: Gynecology;  Laterality: Bilateral;  . TOOTH EXTRACTION    . WISDOM TOOTH EXTRACTION      Allergies: Allergies  Allergen Reactions  . Shellfish Allergy Hives and Itching    Medications:  Current Outpatient Medications:  .  acetaminophen (TYLENOL) 500 MG tablet, Take 500 mg by mouth every 6 (six) hours as needed for mild pain or headache., Disp: , Rfl:  .  albuterol (PROVENTIL HFA;VENTOLIN HFA) 108 (90 Base) MCG/ACT inhaler, Inhale 1-2 puffs into the lungs every 6 (six) hours as needed for wheezing or shortness of  breath., Disp: , Rfl:  .  Amino Acids-Protein Hydrolys (FEEDING SUPPLEMENT, PRO-STAT SUGAR FREE 64,) LIQD, Take 30 mLs by mouth 2 (two) times daily. (Patient not taking: Reported on 12/17/2017), Disp: 900 mL, Rfl: 0 .  docusate sodium (COLACE) 100 MG capsule, Take 1 capsule (100 mg total) by mouth 2 (two) times daily. (Patient not taking: Reported on 12/17/2017), Disp: 10 capsule,  Rfl: 0 .  mirabegron ER (MYRBETRIQ) 25 MG TB24 tablet, Take 1 tablet (25 mg total) by mouth daily. (Patient not taking: Reported on 12/17/2017), Disp: 30 tablet, Rfl: 0 .  oxyCODONE-acetaminophen (PERCOCET/ROXICET) 5-325 MG tablet, Take 1-2 tablets by mouth every 6 (six) hours as needed for moderate pain., Disp: 30 tablet, Rfl: 0 .  [START ON 01/17/2018] rivaroxaban (XARELTO) 20 MG TABS tablet, Take 1 tablet (20 mg total) by mouth daily with supper., Disp: 30 tablet, Rfl: 1 .  Rivaroxaban 15 & 20 MG TBPK, Take as directed on package: Start with one 15mg  tablet by mouth twice a day with food. On Day 22, switch to one 20mg  tablet once a day with food., Disp: 51 each, Rfl: 0 .  sulfamethoxazole-trimethoprim (BACTRIM DS,SEPTRA DS) 800-160 MG tablet, Take 1 tablet by mouth every 12 (twelve) hours. (Patient not taking: Reported on 12/17/2017), Disp: 20 tablet, Rfl: 0  Social History: She works in the [employer] at [employer] [place]. She has never smoked.  She reports that she rarely drinks alcohol, occasional glass of wine.  She does not use drugs.  Family History: See HPI  Review of Systems: See HPI Remaining ROS negative.  Physical Exam: Blood pressure 126/80, pulse (!) 55, temperature 98.3 F (36.8 C), temperature source Oral, height 5\' 5"  (1.651 m), weight 160 lb (72.6 kg), last menstrual period 10/31/2017, SpO2 100 %. Wt Readings from Last 3 Encounters:  01/04/18 160 lb (72.6 kg)  12/17/17 160 lb (72.6 kg)  11/13/17 179 lb 14.3 oz (81.6 kg)     General appearance: Pleasant African-American woman HENNT: Pharynx no erythema, exudate, mass, or ulcer. No thyromegaly or thyroid nodules Lymph nodes: No cervical, supraclavicular, or axillary lymphadenopathy Breasts: Lungs: Clear to auscultation, resonant to percussion throughout Heart: Regular rhythm, no murmur, no gallop, no rub, no click, no edema Abdomen: Soft, nontender, normal bowel sounds, no mass, no  organomegaly Extremities: Asymmetric edema, no calf tenderness Calf measurements: 38 cm on the left, 36 cm on the right Ankle measurements: 22 cm on the left, 21.5 on the right Musculoskeletal: no joint deformities GU:  Vascular: Carotid pulses 2+, no bruits, distal pulses: Dorsalis pedis 1+ left foot, not palpable on the right.  Feet are warm and well perfused.  No evidence for significant postphlebitic syndrome or compartment syndrome. Neurologic: Alert, oriented, PERRLA, optic discs not well visualized, cranial nerves grossly normal, motor strength 5 over 5, reflexes 1+ symmetric, upper body coordination normal, gait normal, Skin: No rash or ecchymosis    Lab Results: Lab Results  Component Value Date   WBC 7.1 12/17/2017   HGB 12.1 12/17/2017   HCT 37.5 12/17/2017   MCV 82.2 12/17/2017   PLT 257 12/17/2017     Chemistry      Component Value Date/Time   NA 138 12/17/2017 1352   K 3.9 12/17/2017 1352   CL 107 12/17/2017 1352   CO2 23 12/17/2017 1352   BUN 8 12/17/2017 1352   CREATININE 0.80 12/17/2017 1352      Component Value Date/Time   CALCIUM 9.0 12/17/2017 1352  ALKPHOS 60 11/17/2017 0600   AST 21 11/17/2017 0600   ALT 16 11/17/2017 0600   BILITOT 0.5 11/17/2017 0600       Radiological Studies: No results found.    Impression: Extensive but provoked proximal left lower extremity DVT extending into the femoral and iliac veins. Clinically patient appears to be recanalizing the veins.  There is no significant leg edema or pain out of proportion to other patients with proximal DVT. No other risk factors other than prolonged surgery.    Recommendation: 3 months of full dose anticoagulation is sufficient treatment. Based on current guidelines of my professional hematology society, extensive lab testing (hypercoagulation profile) not indicated and will not change management. I am writing a prescription for a ankle to thigh Jobst garment.  This has been shown in  a recent prospective clinical trial to reduce the incidence of postphlebitic syndrome. No indication to repeat a interval venous Doppler study. One could consider checking a d-dimer 1 month after she comes off anticoagulation and if it is elevated, continue additional short-term anticoagulation.    Murriel Hopper, MD, [facility]/Internal Medicine  01/05/2018, 4:47 PM

## 2018-01-07 DIAGNOSIS — D259 Leiomyoma of uterus, unspecified: Secondary | ICD-10-CM | POA: Diagnosis not present

## 2018-03-17 ENCOUNTER — Other Ambulatory Visit: Payer: Self-pay | Admitting: Oncology

## 2018-03-17 NOTE — Telephone Encounter (Signed)
Dx DVT Aug 1. Dr Synthia Innocent note "3 months of full dose anticoagulation is sufficient treatment." He planned d dimer one month after stopping DOAC and sch early Dec appt. All this supports that she completed her tx. Is she under a different impression?

## 2018-04-23 ENCOUNTER — Ambulatory Visit: Payer: Self-pay | Admitting: Internal Medicine

## 2018-05-14 ENCOUNTER — Encounter: Payer: Self-pay | Admitting: Nurse Practitioner

## 2018-05-14 ENCOUNTER — Ambulatory Visit: Payer: BLUE CROSS/BLUE SHIELD | Admitting: Nurse Practitioner

## 2018-05-14 VITALS — BP 134/86 | HR 55 | Temp 97.7°F | Ht 63.0 in | Wt 169.0 lb

## 2018-05-14 DIAGNOSIS — Z8709 Personal history of other diseases of the respiratory system: Secondary | ICD-10-CM | POA: Diagnosis not present

## 2018-05-14 DIAGNOSIS — Z0000 Encounter for general adult medical examination without abnormal findings: Secondary | ICD-10-CM

## 2018-05-14 DIAGNOSIS — M79605 Pain in left leg: Secondary | ICD-10-CM | POA: Diagnosis not present

## 2018-05-14 DIAGNOSIS — Z13228 Encounter for screening for other metabolic disorders: Secondary | ICD-10-CM

## 2018-05-14 DIAGNOSIS — Z86718 Personal history of other venous thrombosis and embolism: Secondary | ICD-10-CM

## 2018-05-14 LAB — POCT URINALYSIS DIPSTICK
Bilirubin, UA: NEGATIVE
GLUCOSE UA: NEGATIVE
Ketones, UA: NEGATIVE
LEUKOCYTES UA: NEGATIVE
Nitrite, UA: NEGATIVE
PH UA: 5.5 (ref 5.0–8.0)
Protein, UA: NEGATIVE
RBC UA: NEGATIVE
Spec Grav, UA: 1.02 (ref 1.010–1.025)
Urobilinogen, UA: 0.2 E.U./dL

## 2018-05-14 MED ORDER — ALBUTEROL SULFATE HFA 108 (90 BASE) MCG/ACT IN AERS: 2.0000 | INHALATION_SPRAY | Freq: Four times a day (QID) | RESPIRATORY_TRACT | 2 refills | Status: DC | PRN

## 2018-05-14 NOTE — Progress Notes (Signed)
Subjective:     Patient ID: Kathleen Cole , female    DOB: April 15, 1976 , 42 y.o.   MRN: [id number]   Chief Complaint  Patient presents with  . Establish Care  . Leg Pain    Patient states she had recently had a hysterectomy at the end of june and she ended up with a blood clot in her left leg. patient stated or the past couple of days she has been having some leg tightness and she is not sure if it is just from the compression socks    HPI  Here to establish care - has not seen a primary care provider in more than 5 years. 2 children healthy.  Married.  [employer].  Non-smoker.  Social wine drinker.  She only drinks about 1 glass per day.   PMH - Asthma (as a child).    June 27th hysterectomy due to fibroid (10 lbs).  She has been to see a hematologist and told to follow up in 2023/05/10.    [facility] - Father - died from pneumonia.  Mother - lung disease (unknown) - 8 years.    Leg Pain   The incident occurred more than 1 week ago. There was no injury mechanism (left leg feels tight after removing compression stocking began feeling last week.  ). The pain is present in the left leg. Quality: discomfort. The patient is experiencing no pain. The pain has been intermittent since onset. She reports no foreign bodies present. Exacerbated by: removing compression stocking. The treatment provided no relief.     Past Medical History:  Diagnosis Date  . Acute deep vein thrombosis (DVT) of proximal end of left lower extremity ([place]) 01/04/2018   12/17/17 provoked extensive proximal DVT 4-5 wks post Hysterectomy complicated by tear in bladder  . Anemia   . Asthma    rarely uses inhaler -uses when patient gets sick  . Seasonal allergies   . SVD (spontaneous vaginal delivery)    x 2     Family History  Problem Relation Age of Onset  . Lung disease Mother   . Pneumonia Father     No current outpatient medications on file.   Allergies  Allergen Reactions  . Shellfish Allergy  Hives and Itching     Review of Systems   Today's Vitals   05/14/18 1442  BP: 134/86  Pulse: (!) 55  Temp: 97.7 F (36.5 C)  TempSrc: Oral  SpO2: 96%  Weight: 169 lb (76.7 kg)  Height: 5\' 3"  (1.6 m)  PainSc: 0-No pain   Body mass index is 29.94 kg/m.   Objective:  Physical Exam Vitals signs reviewed.  Constitutional:      Appearance: She is obese.  Cardiovascular:     Rate and Rhythm: Normal rate and regular rhythm.     Heart sounds: No murmur.  Pulmonary:     Effort: Pulmonary effort is normal. No respiratory distress.     Breath sounds: Normal breath sounds.  Musculoskeletal: Normal range of motion.        General: No swelling or tenderness.     Left lower leg: Edema (trace) present.  Skin:    General: Skin is warm and dry.  Neurological:     General: No focal deficit present.     Mental Status: She is alert and oriented to person, place, and time.  Psychiatric:        Mood and Affect: Mood normal.  Assessment And Plan:     1. Encounter for screening for metabolic disorder  - Hemoglobin A1c - Lipid Profile  2. Left leg pain  Intermittent leg pain with mild swelling concerned about having a DVT.  Previous history of DVT 11/12/17   3. History of DVT (deep vein thrombosis)  Status post hysterectomy in 11/12/17 and developed a DVT  Took Xarelto for 6 months - D-dimer, quantitative (not at [facility]) - CBC no Diff  4. History of asthma  Provided albuterol inhaler due to her history of asthma         Minette Brine, FNP

## 2018-05-15 LAB — LIPID PANEL
CHOL/HDL RATIO: 3.4 ratio (ref 0.0–4.4)
Cholesterol, Total: 159 mg/dL (ref 100–199)
HDL: 47 mg/dL (ref >39)
LDL CALC: 90 mg/dL (ref 0–99)
TRIGLYCERIDES: 110 mg/dL (ref 0–149)
VLDL Cholesterol Cal: 22 mg/dL (ref 5–40)

## 2018-05-15 LAB — CBC
Hematocrit: 35 % (ref 34.0–46.6)
Hemoglobin: 11.2 g/dL (ref 11.1–15.9)
MCH: 24.3 pg — AB (ref 26.6–33.0)
MCHC: 32 g/dL (ref 31.5–35.7)
MCV: 76 fL — ABNORMAL LOW (ref 79–97)
PLATELETS: 233 10*3/uL (ref 150–450)
RBC: 4.61 x10E6/uL (ref 3.77–5.28)
RDW: 14.1 % (ref 12.3–15.4)
WBC: 7 10*3/uL (ref 3.4–10.8)

## 2018-05-15 LAB — HEMOGLOBIN A1C
Est. average glucose Bld gHb Est-mCnc: 105 mg/dL
HEMOGLOBIN A1C: 5.3 % (ref 4.8–5.6)

## 2018-05-17 ENCOUNTER — Telehealth (HOSPITAL_COMMUNITY): Payer: Self-pay | Admitting: *Deleted

## 2018-05-17 NOTE — Telephone Encounter (Addendum)
  05/18/2018 left msg asking pt to call to schedule appt. 05/17/2018 attempted to reach pt to schedule appt.  Voicemail box full unable to leave msg

## 2018-05-20 ENCOUNTER — Telehealth: Payer: Self-pay | Admitting: Nurse Practitioner

## 2018-05-20 ENCOUNTER — Telehealth (HOSPITAL_COMMUNITY): Payer: Self-pay | Admitting: *Deleted

## 2018-05-20 NOTE — Telephone Encounter (Signed)
05/20/18 left msg asking pt to return my call to schedule appt

## 2018-05-20 NOTE — Telephone Encounter (Signed)
Called patient to inform her to contact the cardiologist to schedule her doppler.  She informed me she will call them.

## 2018-05-27 ENCOUNTER — Ambulatory Visit (HOSPITAL_COMMUNITY)
Admission: RE | Admit: 2018-05-27 | Discharge: 2018-05-27 | Disposition: A | Discharge status: Home / Self Care | Payer: BLUE CROSS/BLUE SHIELD | Source: Ambulatory Visit | Attending: Cardiovascular Disease | Admitting: Cardiovascular Disease

## 2018-05-27 ENCOUNTER — Telehealth: Payer: Self-pay | Admitting: Nurse Practitioner

## 2018-05-27 DIAGNOSIS — Z86718 Personal history of other venous thrombosis and embolism: Secondary | ICD-10-CM | POA: Diagnosis not present

## 2018-05-27 DIAGNOSIS — M79605 Pain in left leg: Secondary | ICD-10-CM | POA: Diagnosis not present

## 2018-05-27 MED ORDER — RIVAROXABAN 15 MG PO TABS: 15.0000 mg | ORAL_TABLET | Freq: Two times a day (BID) | ORAL | 0 refills | Status: DC

## 2018-06-01 NOTE — Progress Notes (Signed)
Noted on blood thinner

## 2018-06-15 ENCOUNTER — Other Ambulatory Visit: Payer: Self-pay | Admitting: Nurse Practitioner

## 2018-07-16 ENCOUNTER — Other Ambulatory Visit: Payer: Self-pay | Admitting: Nurse Practitioner

## 2018-07-30 ENCOUNTER — Encounter: Payer: BLUE CROSS/BLUE SHIELD | Admitting: Nurse Practitioner

## 2018-08-02 ENCOUNTER — Encounter: Payer: Self-pay | Admitting: *Deleted

## 2018-09-03 ENCOUNTER — Encounter: Payer: BLUE CROSS/BLUE SHIELD | Admitting: Nurse Practitioner

## 2018-10-03 ENCOUNTER — Other Ambulatory Visit: Payer: Self-pay | Admitting: Nurse Practitioner

## 2018-10-04 ENCOUNTER — Telehealth: Payer: Self-pay

## 2018-10-04 NOTE — Telephone Encounter (Signed)
PT NEEDS APPT FOR REFILL

## 2019-04-03 IMAGING — DX DG CHEST 1V PORT
1 series · 1 of 1 positions shown · non-contrast
Comparison: None.

CLINICAL DATA: Ett placed

EXAM:
PORTABLE CHEST 1 VIEW

[chest ap]
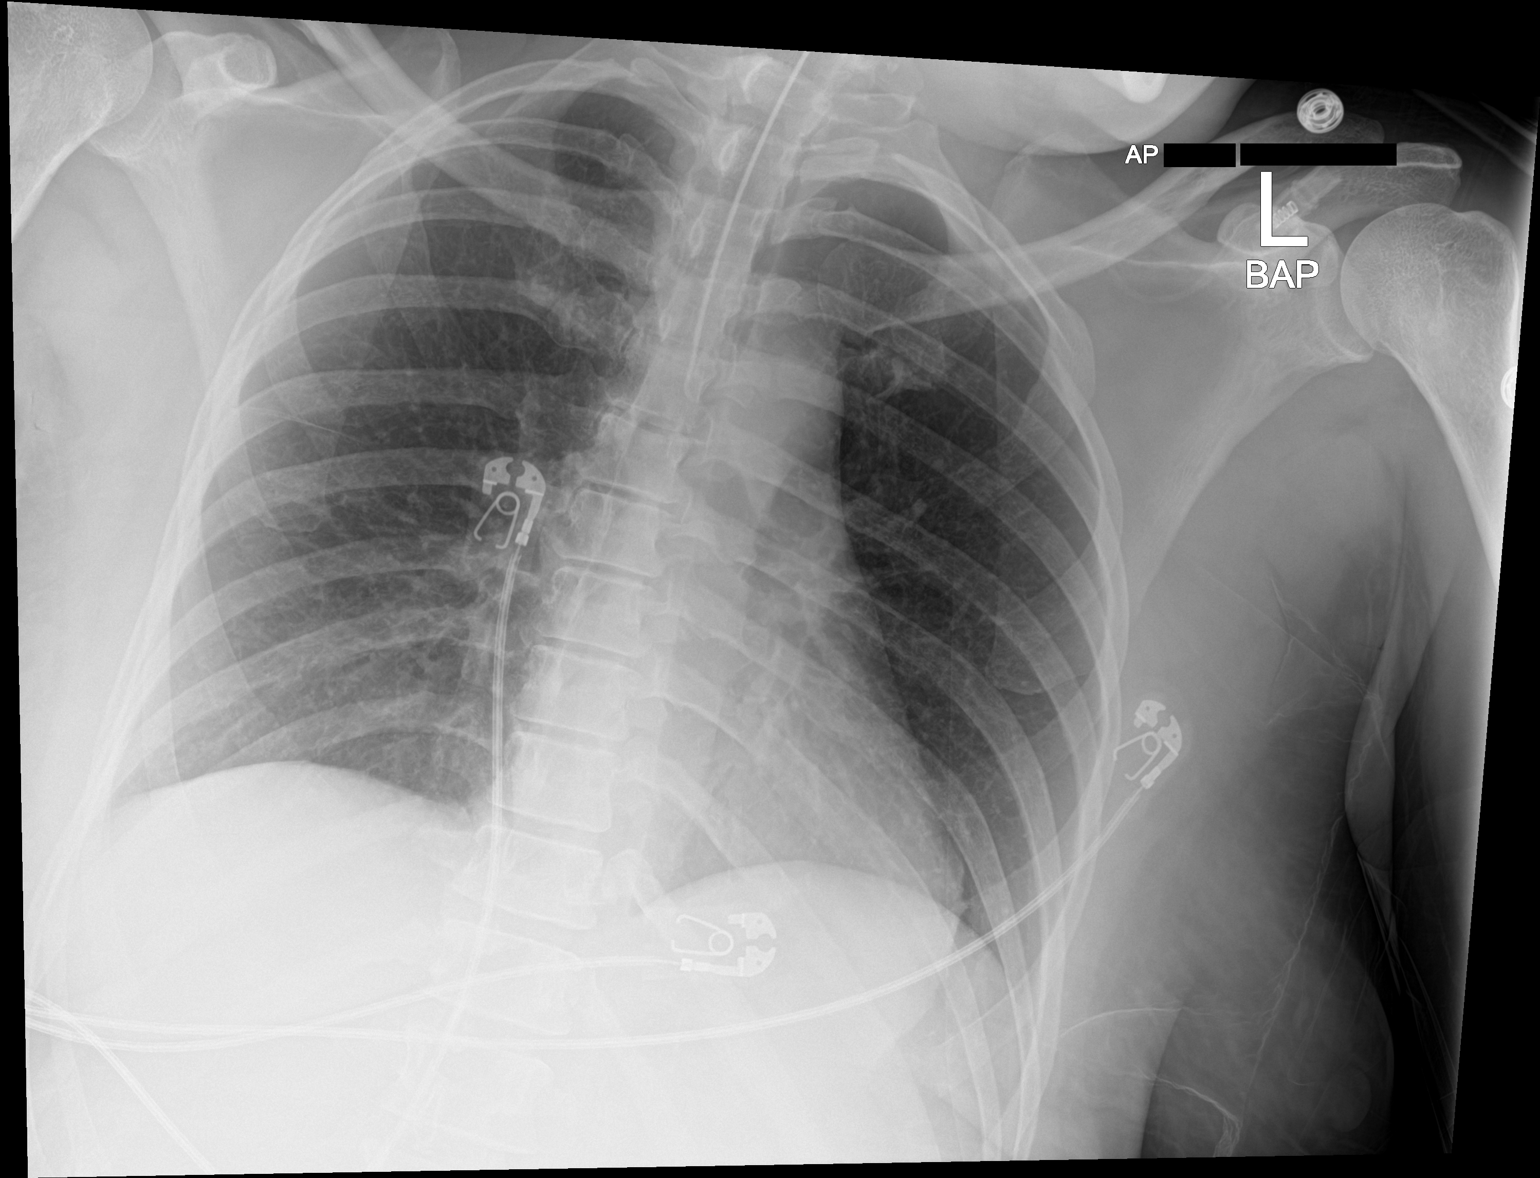

[1 of 1 positions shown; findings below may reference images not displayed]

FINDINGS: An endotracheal tube is in place, tip approximately 1.7 centimeters
above the carina. Heart size is normal. There are no focal
consolidations or pleural effusions. No pulmonary edema.
IMPRESSION: Two endotracheal tube in place, 1.7 centimeters above carina.

## 2019-04-03 IMAGING — DX DG ABD PORTABLE 1V
1 series · 1 of 1 positions shown · non-contrast
Comparison: None.

CLINICAL DATA: Evaluate OG tube

EXAM:
PORTABLE ABDOMEN - 1 VIEW

[abdomen kub]
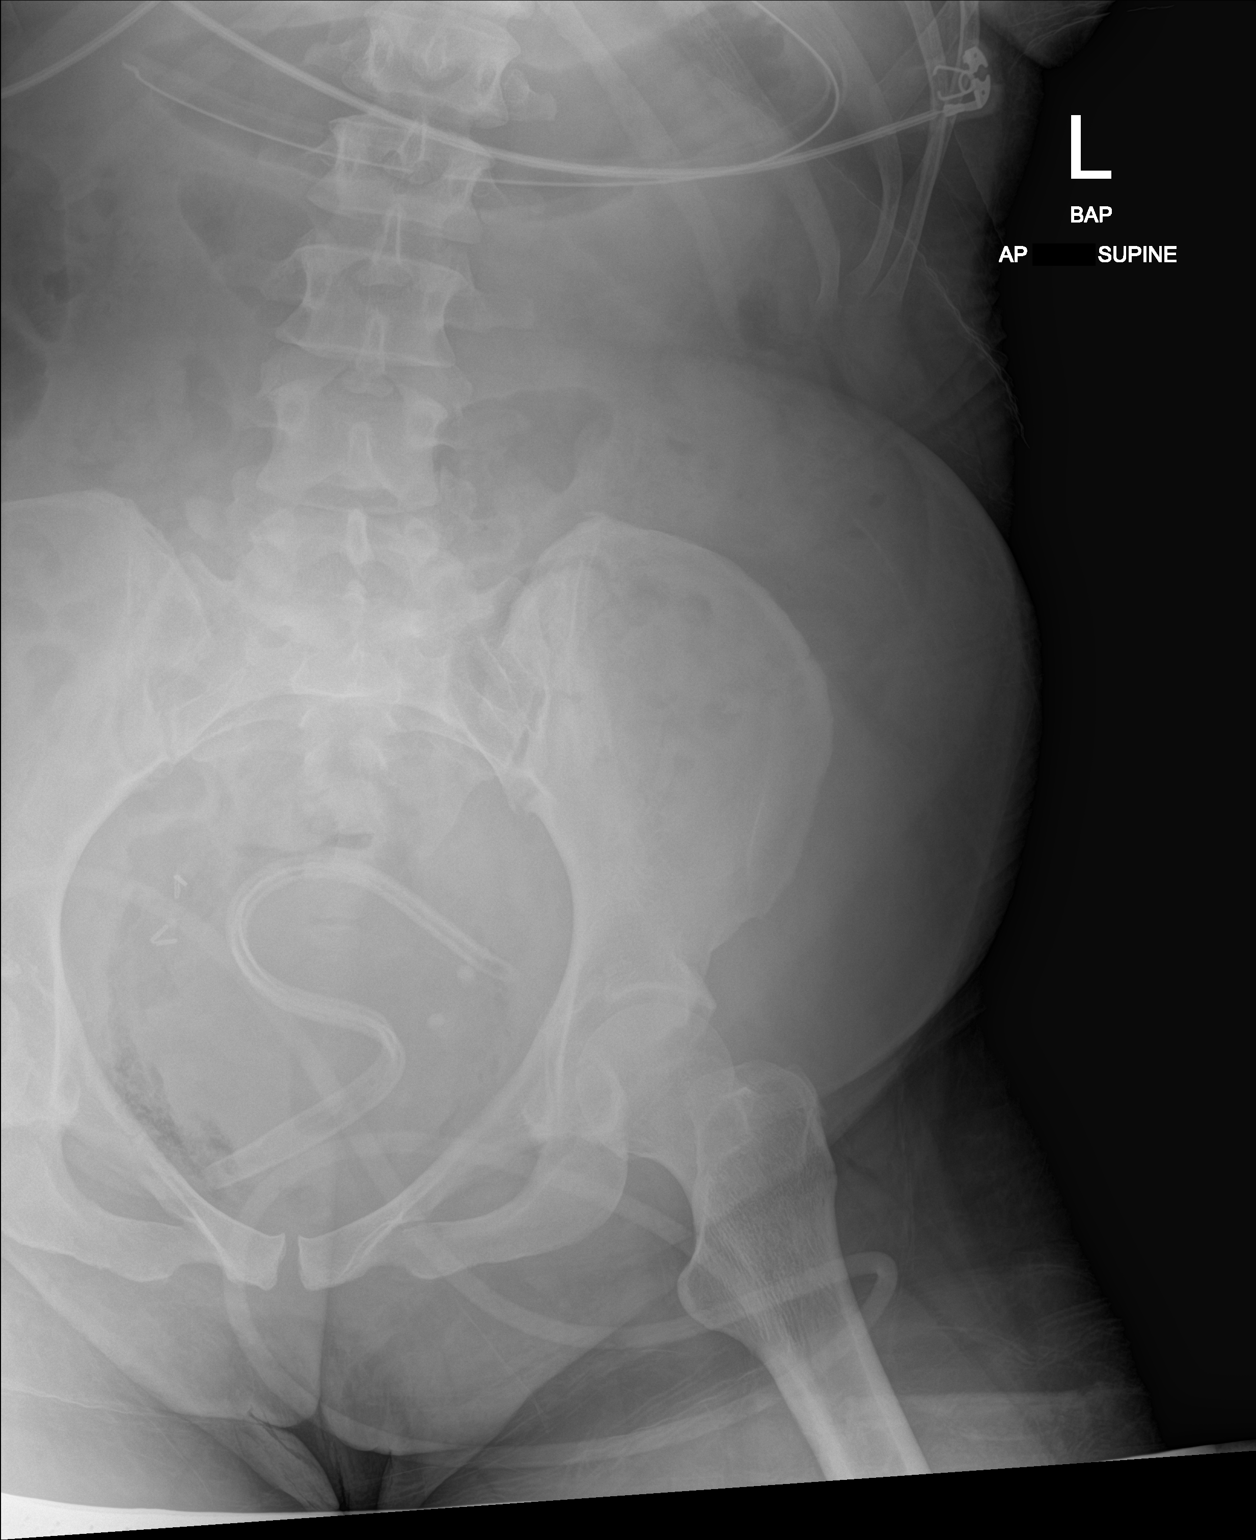

[1 of 1 positions shown; findings below may reference images not displayed]

FINDINGS: The OG tube terminates in the distal stomach. There is some sort a
catheter or drain in the pelvis.
IMPRESSION: 1. The distal tip of the OG tube is in the distal stomach.
2. Some sort a catheter or drain is seen in the pelvis.

## 2019-05-22 IMAGING — CT CT PELVIS W/ CM
1 of 2 series · 14 of 32 positions shown, 19 images · IV contrast (APPLIED)
Comparison: None.

CLINICAL DATA: Approximately 2 months status post hysterectomy for
large retroperitoneal fibroid, complicated by large retroperitoneal
bleed.

EXAM:
CT PELVIS WITH CONTRAST
TECHNIQUE: Multidetector CT imaging of the pelvis was performed using the
standard protocol following the bolus administration of intravenous
contrast.
CONTRAST:  100mL QT6HU6-3RR IOPAMIDOL (QT6HU6-3RR) INJECTION 61%,

[Series 2: routine pelvis w/cm · axial · 0.70mm/px · z∈[-288,-88]mm · 14 of 46 slices shown, 19 images]
[im 3/46  soft-tissue]
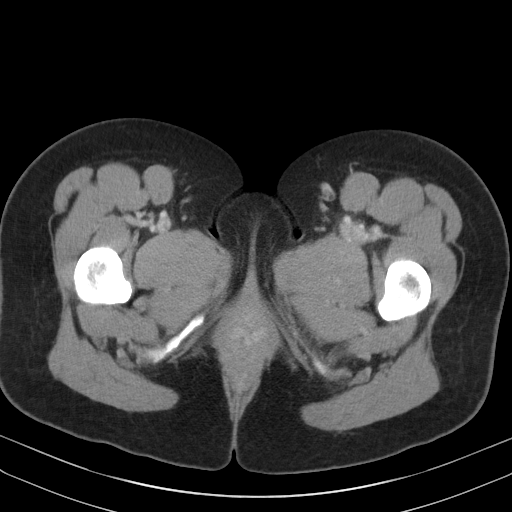
[im 3/46  bone]
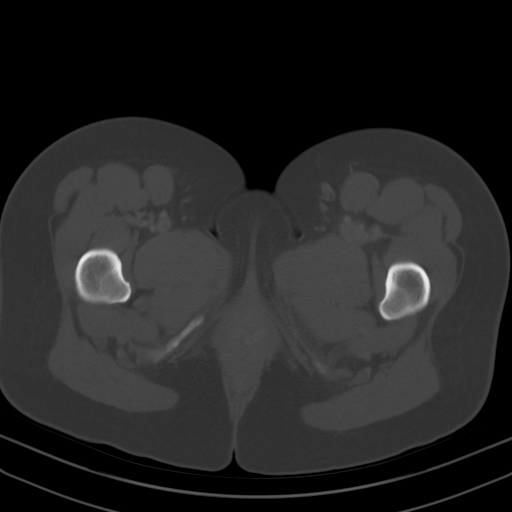
[im 6/46  soft-tissue]
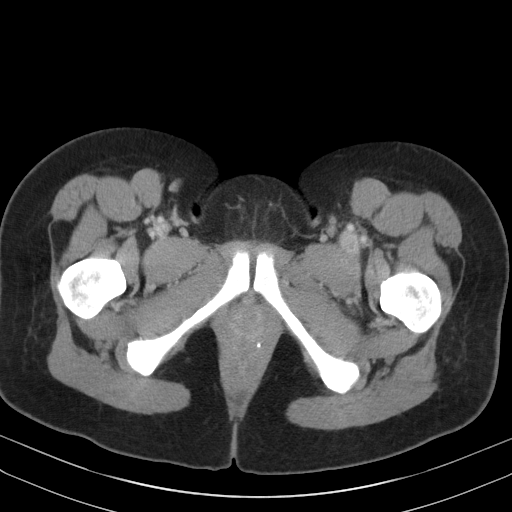
[im 11/46  soft-tissue]
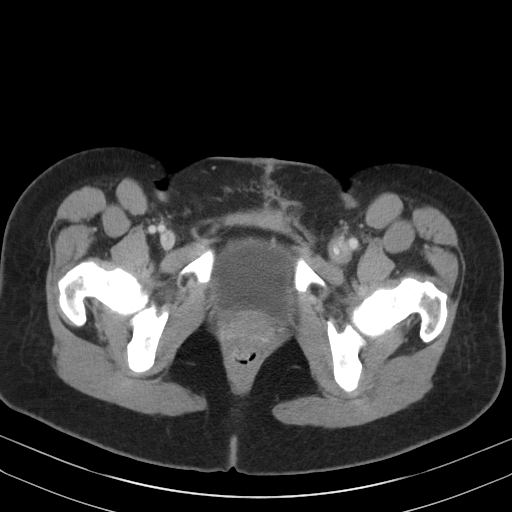
[im 14/46  soft-tissue]
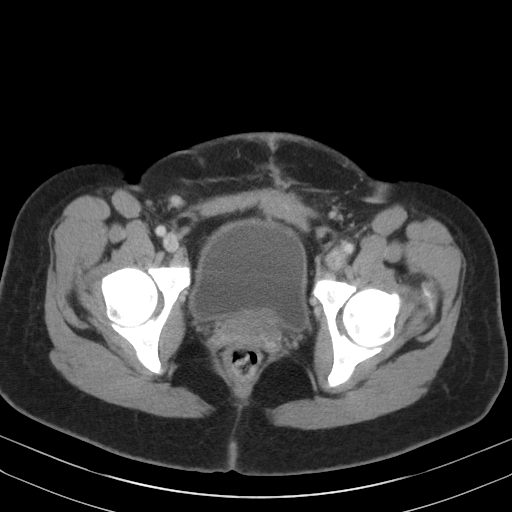
[im 16/46  soft-tissue]
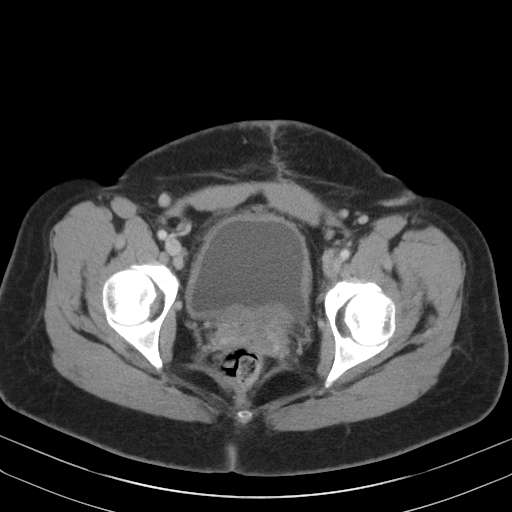
[im 19/46  soft-tissue]
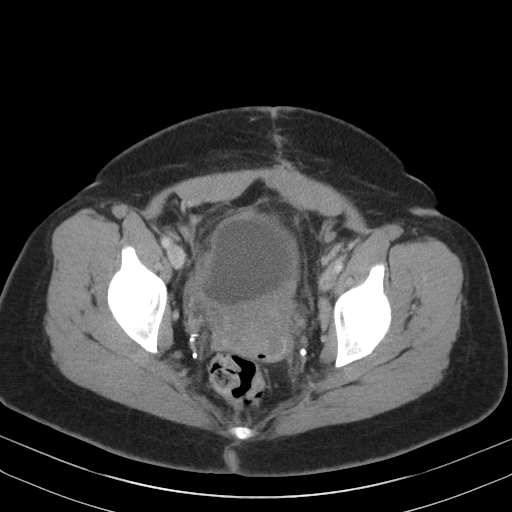
[im 24/46  soft-tissue]
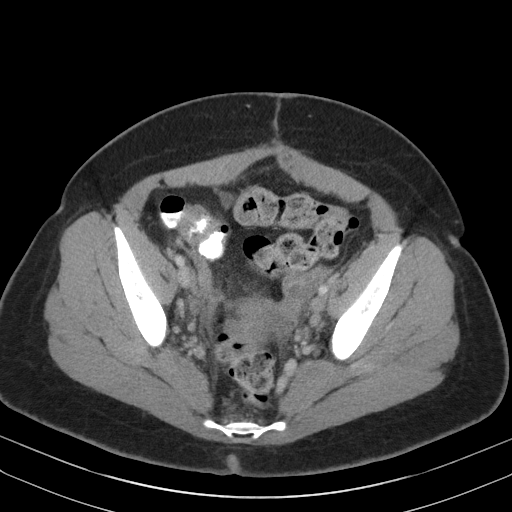
[im 27/46  soft-tissue]
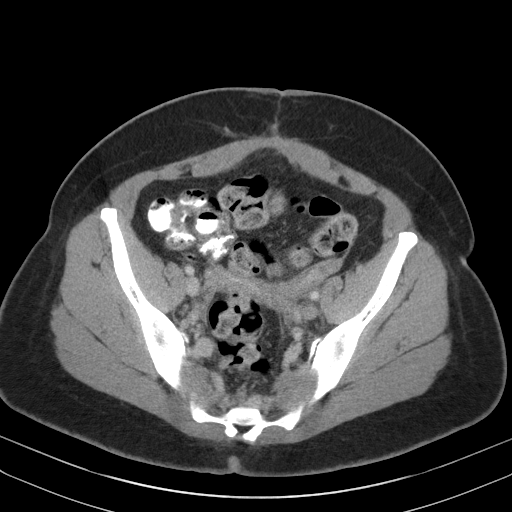
[im 30/46  soft-tissue]
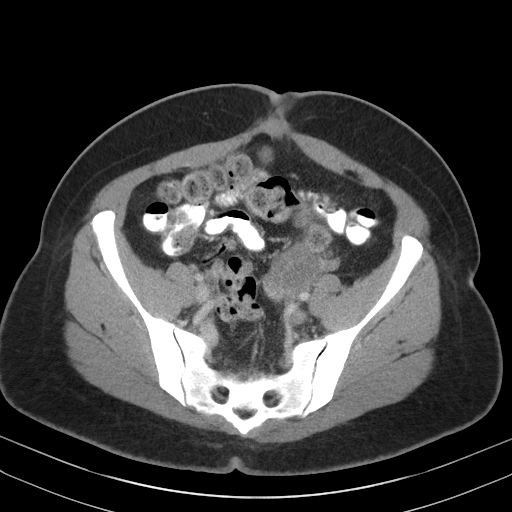
[im 30/46  bone]
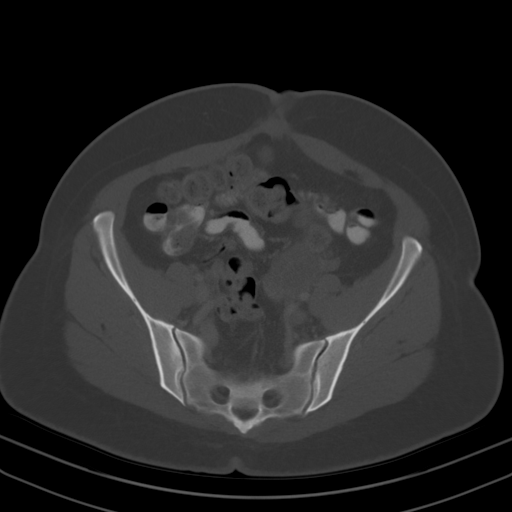
[im 32/46  soft-tissue]
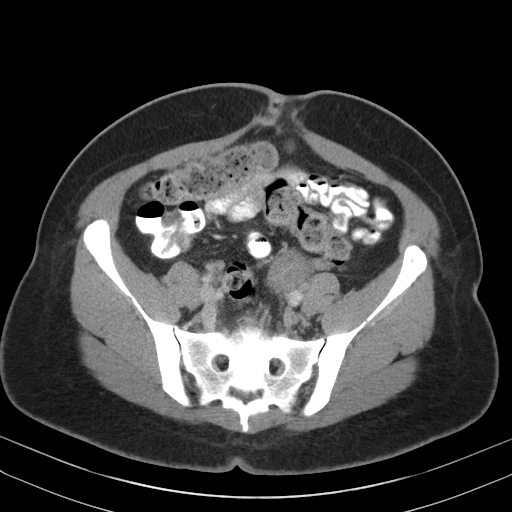
[im 35/46  soft-tissue]
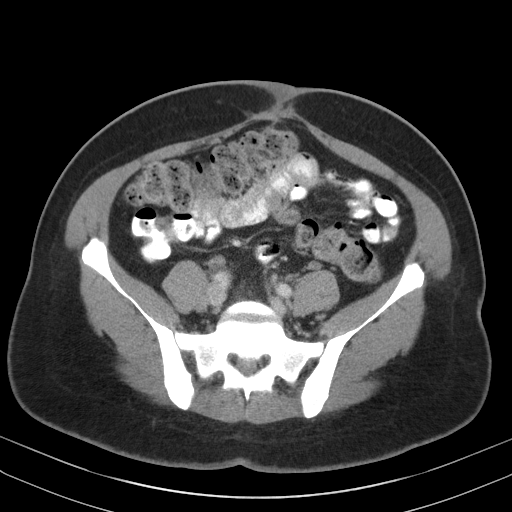
[im 35/46  lung]
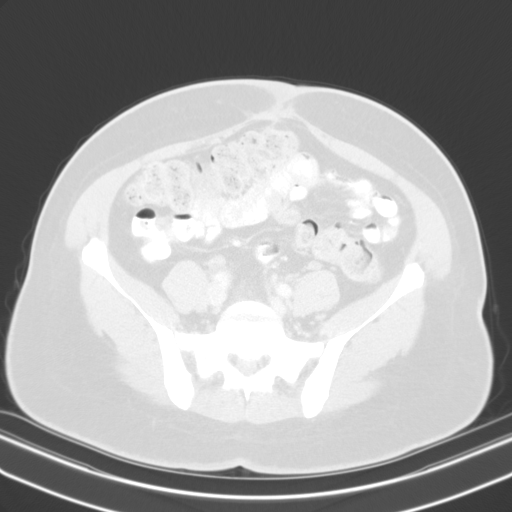
[im 38/46  lung]
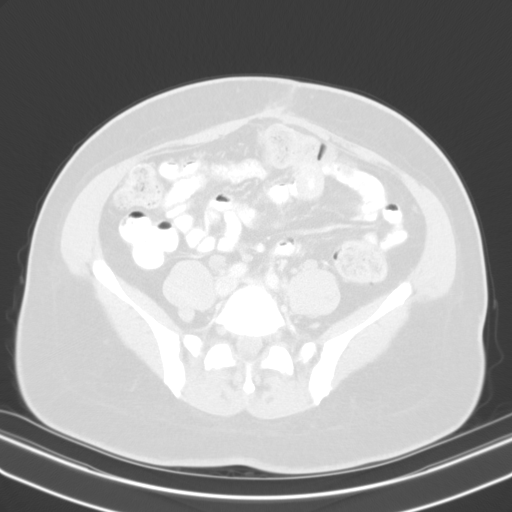
[im 40/46  soft-tissue]
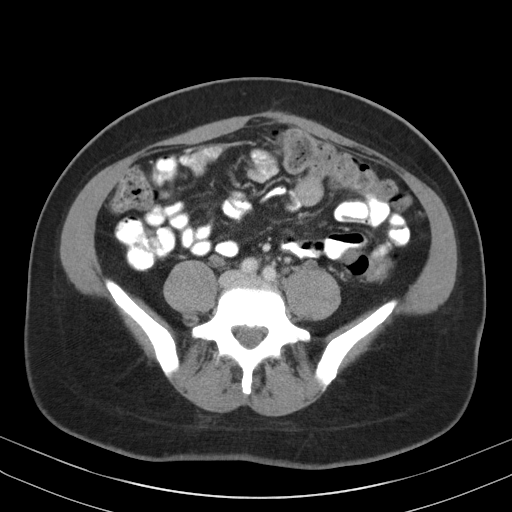
[im 40/46  lung]
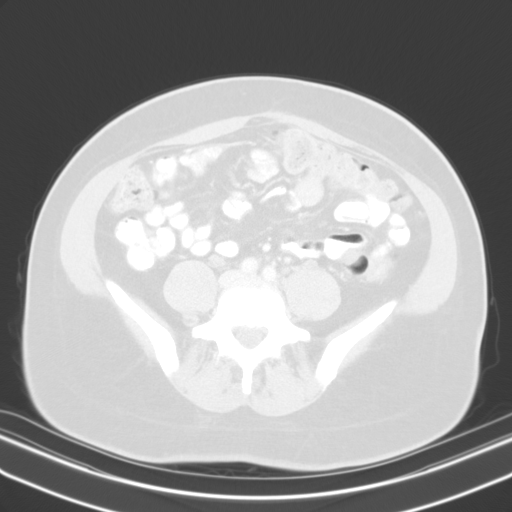
[im 43/46  soft-tissue]
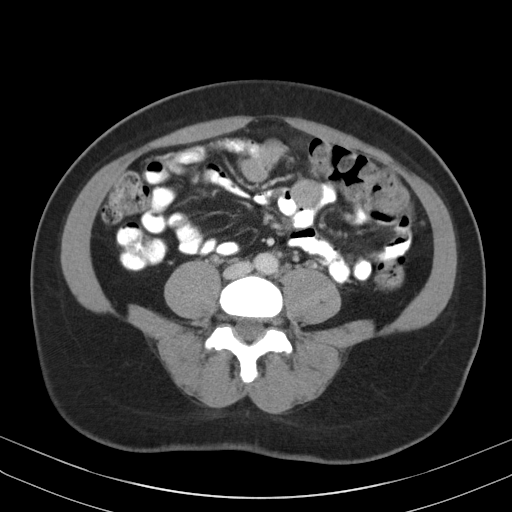
[im 43/46  lung]
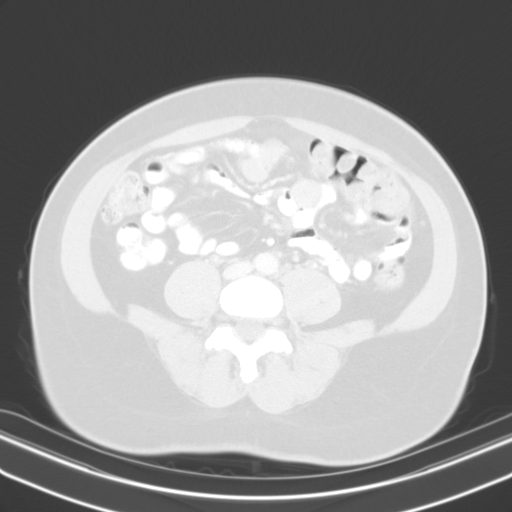

[14 of 32 positions shown; findings below may reference images not displayed]

FINDINGS: Urinary Tract: Mild diffuse bladder wall thickening is seen,
suspicious for cystitis. The distal ureters show contrast
opacification on delayed images, and there is no evidence of leak of
contrast from the distal ureters or urinary bladder.

Bowel: Unremarkable pelvic bowel loops.

Vascular/Lymphatic: No pathologically enlarged lymph nodes or other
significant abnormality.

Reproductive: Postop changes are seen from supracervical
hysterectomy, with cervix remaining. A heterogeneous soft tissue
density is seen adjacent to the left iliac bifurcation which
measures 4.3 x 3.1 cm. This appears to be located above the left
ovary, and is suspicious for postop seroma or lymphocele. Abscess is
considered less likely. No evidence of free fluid.

Other: None.

Musculoskeletal: No significant abnormality identified.
IMPRESSION: Postop changes from supracervical hysterectomy, with cervix
remaining.

4.3 cm heterogeneous soft tissue density adjacent to the left iliac
bifurcation, suspicious for postop seroma or lymphocele, or less
likely abscess. Recommend clinical correlation, and consider
continued follow-up by CT in 2-3 months.

Mild diffuse bladder wall thickening, suspicious for cystitis. No
evidence of injury to distal ureters or bladder.

## 2019-07-05 DIAGNOSIS — I82409 Acute embolism and thrombosis of unspecified deep veins of unspecified lower extremity: Secondary | ICD-10-CM | POA: Diagnosis not present

## 2019-07-05 DIAGNOSIS — Z124 Encounter for screening for malignant neoplasm of cervix: Secondary | ICD-10-CM | POA: Diagnosis not present

## 2019-07-05 DIAGNOSIS — Z6830 Body mass index (BMI) 30.0-30.9, adult: Secondary | ICD-10-CM | POA: Diagnosis not present

## 2019-07-05 DIAGNOSIS — Z01419 Encounter for gynecological examination (general) (routine) without abnormal findings: Secondary | ICD-10-CM | POA: Diagnosis not present

## 2019-08-04 DIAGNOSIS — R922 Inconclusive mammogram: Secondary | ICD-10-CM | POA: Diagnosis not present

## 2019-08-04 DIAGNOSIS — N6001 Solitary cyst of right breast: Secondary | ICD-10-CM | POA: Diagnosis not present

## 2019-09-03 ENCOUNTER — Ambulatory Visit: Payer: BC Managed Care – PPO | Attending: Internal Medicine

## 2019-09-03 DIAGNOSIS — Z23 Encounter for immunization: Secondary | ICD-10-CM

## 2019-09-03 NOTE — Progress Notes (Signed)
   Covid-19 Vaccination Clinic  Name:  MARSHEILA TEALE    MRN: [id number] DOB: Aug 05, 1975  09/03/2019  Ms. Basinski was observed post Covid-19 immunization for 15 minutes without incident. She was provided with Vaccine Information Sheet and instruction to access the V-Safe system.   Ms. Canche was instructed to call 911 with any severe reactions post vaccine: Marland Kitchen Difficulty breathing  . Swelling of face and throat  . A fast heartbeat  . A bad rash all over body  . Dizziness and weakness   Immunizations Administered    Name Date Dose VIS Date Route   Pfizer COVID-19 Vaccine 09/03/2019  2:46 PM 0.3 mL 04/29/2019 Intramuscular   Manufacturer: [place]   Lot: [id number]   [place]: [id number]

## 2019-09-26 ENCOUNTER — Ambulatory Visit: Payer: BC Managed Care – PPO | Attending: Internal Medicine

## 2019-09-26 DIAGNOSIS — Z23 Encounter for immunization: Secondary | ICD-10-CM

## 2019-09-26 NOTE — Progress Notes (Addendum)
   Covid-19 Vaccination Clinic  Name:  Kathleen Cole    MRN: [id number] DOB: [birth year]  09/26/2019  Kathleen Cole was observed post Covid-19 immunization for 30 minutes without incident. She was provided with Vaccine Information Sheet and instruction to access the V-Safe system.   Kathleen Cole was instructed to call 911 with any severe reactions post vaccine: Marland Kitchen Difficulty breathing  . Swelling of face and throat  . A fast heartbeat  . A bad rash all over body  . Dizziness and weakness   Immunizations Administered    Name Date Dose VIS Date Route   Pfizer COVID-19 Vaccine 09/26/2019 10:37 AM 0.3 mL 07/13/2018 Intramuscular   Manufacturer: [place]   Lot: [id number]   [place]: [id number]

## 2020-02-02 DIAGNOSIS — H5213 Myopia, bilateral: Secondary | ICD-10-CM | POA: Diagnosis not present

## 2021-02-15 NOTE — Telephone Encounter (Signed)
NA

## 2023-01-27 ENCOUNTER — Encounter (HOSPITAL_COMMUNITY): Payer: Self-pay

## 2023-01-27 ENCOUNTER — Emergency Department (HOSPITAL_COMMUNITY): Payer: BC Managed Care – PPO

## 2023-01-27 ENCOUNTER — Emergency Department (HOSPITAL_COMMUNITY)
Admission: EM | Admit: 2023-01-27 | Discharge: 2023-01-28 | Disposition: A | Discharge status: Home / Self Care | Payer: BC Managed Care – PPO | Attending: Emergency Medicine | Admitting: Emergency Medicine

## 2023-01-27 ENCOUNTER — Other Ambulatory Visit: Payer: Self-pay

## 2023-01-27 DIAGNOSIS — J45909 Unspecified asthma, uncomplicated: Secondary | ICD-10-CM | POA: Diagnosis not present

## 2023-01-27 DIAGNOSIS — Z1152 Encounter for screening for COVID-19: Secondary | ICD-10-CM | POA: Insufficient documentation

## 2023-01-27 DIAGNOSIS — R0602 Shortness of breath: Secondary | ICD-10-CM | POA: Diagnosis not present

## 2023-01-27 DIAGNOSIS — J45901 Unspecified asthma with (acute) exacerbation: Secondary | ICD-10-CM | POA: Insufficient documentation

## 2023-01-27 DIAGNOSIS — E876 Hypokalemia: Secondary | ICD-10-CM | POA: Insufficient documentation

## 2023-01-27 MED ORDER — ALBUTEROL SULFATE HFA 108 (90 BASE) MCG/ACT IN AERS: 4.0000 | INHALATION_SPRAY | RESPIRATORY_TRACT | 0 refills | Status: DC | PRN

## 2023-01-27 MED ORDER — PREDNISONE 50 MG PO TABS: 50.0000 mg | ORAL_TABLET | Freq: Every day | ORAL | 0 refills | Status: AC

## 2023-01-27 MED ORDER — ALBUTEROL SULFATE (2.5 MG/3ML) 0.083% IN NEBU
15.0000 mg/h | INHALATION_SOLUTION | Freq: Once | RESPIRATORY_TRACT | Status: AC
  Administered 2023-01-27: 15 mg/h via RESPIRATORY_TRACT
  Filled 2023-01-27: qty 18

## 2023-01-27 NOTE — ED Provider Notes (Signed)
[place] EMERGENCY DEPARTMENT AT [facility] Provider Note   CSN: [id number] Arrival date & time: 01/27/23  2235     History Chief Complaint  Patient presents with   Shortness of Breath    HPI Kathleen Cole is a 47 y.o. female presenting for acute on chronic shortness of breath.  Patient states that she has a history of asthma but has been well-controlled for 8 years and then tonight she acutely became very dyspneic with severe wheezing and shortness of breath.  She cannot think of any triggers.  Recent URI symptoms throughout the week.    Patient's recorded medical, surgical, social, medication list and allergies were reviewed in the Snapshot window as part of the initial history.   Review of Systems   Review of Systems  Constitutional:  Negative for chills and fever.  HENT:  Negative for ear pain and sore throat.   Eyes:  Negative for pain and visual disturbance.  Respiratory:  Positive for shortness of breath and wheezing. Negative for cough.   Cardiovascular:  Negative for chest pain and palpitations.  Gastrointestinal:  Negative for abdominal pain and vomiting.  Genitourinary:  Negative for dysuria and hematuria.  Musculoskeletal:  Negative for arthralgias and back pain.  Skin:  Negative for color change and rash.  Neurological:  Negative for seizures and syncope.  All other systems reviewed and are negative.   Physical Exam Updated Vital Signs BP (!) 168/93 (BP Location: Right Arm)   Pulse (!) 117   Temp 97.7 F (36.5 C) (Axillary)   Resp (!) 29   LMP 11/12/2017   SpO2 97%  Physical Exam Vitals and nursing note reviewed.  Constitutional:      General: She is not in acute distress.    Appearance: She is well-developed.  HENT:     Head: Normocephalic and atraumatic.  Eyes:     Conjunctiva/sclera: Conjunctivae normal.  Cardiovascular:     Rate and Rhythm: Normal rate and regular rhythm.     Heart sounds: No murmur heard. Pulmonary:      Effort: Pulmonary effort is normal. Tachypnea present. No respiratory distress.     Breath sounds: Wheezing present.  Abdominal:     General: There is no distension.     Palpations: Abdomen is soft.     Tenderness: There is no abdominal tenderness. There is no right CVA tenderness or left CVA tenderness.  Musculoskeletal:        General: No swelling or tenderness. Normal range of motion.     Cervical back: Neck supple.  Skin:    General: Skin is warm and dry.  Neurological:     General: No focal deficit present.     Mental Status: She is alert and oriented to person, place, and time. Mental status is at baseline.     Cranial Nerves: No cranial nerve deficit.      ED Course/ Medical Decision Making/ A&P    Procedures Procedures   Medications Ordered in ED Medications  albuterol (PROVENTIL) (2.5 MG/3ML) 0.083% nebulizer solution (15 mg/hr Nebulization Given 01/27/23 2327)    Medical Decision Making:    Kathleen Cole is a 47 y.o. female who presented to the ED today with shortness of breath detailed above.     Complete initial physical exam performed, notably the patient  was on a nonrebreather with still diffuse wheezes and tachypnea.      Reviewed and confirmed nursing documentation for past medical history, family history, social history.  Initial Assessment:   With the patient's presentation of shortness of breath, most likely diagnosis is asthma exacerbation. Other diagnoses were considered including (but not limited to) pneumonia pneumothorax ACS PE metabolic disruption, anemia. These are considered less likely due to history of present illness and physical exam findings.   This is most consistent with an acute life/limb threatening illness complicated by underlying chronic conditions.  Initial Plan:  Will continue aggressive resuscitation of her asthma with continued bronchodilator.  She has already had magnesium and Solu-Medrol. Screening labs including CBC and  Metabolic panel to evaluate for infectious or metabolic etiology of disease.  CXR to evaluate for structural/infectious intrathoracic pathology.  EKG to evaluate for cardiac pathology. Objective evaluation as below reviewed with plan for close reassessment  Reassessment and Plan:   All lab work is pending.  X-ray was without focal pathology or pneumonia or pneumothorax.  Care of patient signed out to oncoming provider pending reassessment and results. Care patient handed off to oncoming provider at this time, please see their note for ultimate disposition.   Emergency Department Medication Summary:   Medications  albuterol (PROVENTIL) (2.5 MG/3ML) 0.083% nebulizer solution (15 mg/hr Nebulization Given 01/27/23 2327)         Clinical Impression:  1. Exacerbation of asthma, unspecified asthma severity, unspecified whether persistent      Data Unavailable   Final Clinical Impression(s) / ED Diagnoses Final diagnoses:  Exacerbation of asthma, unspecified asthma severity, unspecified whether persistent    Rx / DC Orders ED Discharge Orders          Ordered    predniSONE (DELTASONE) 50 MG tablet  Daily        01/27/23 2352    albuterol (VENTOLIN HFA) 108 (90 Base) MCG/ACT inhaler  Every 4 hours PRN        01/27/23 2352              Glyn Ade, MD 01/27/23 2352

## 2023-01-27 NOTE — ED Triage Notes (Signed)
Patient from home, asthma exacerbation, rescue inhaler not working. Patient received Albuterol x1, DuoNeb x1, Solu-Medrol 125mg  IV, Mag 2g IV by EMS enroute to the hospital .

## 2023-01-28 LAB — CBC WITH DIFFERENTIAL/PLATELET
Abs Immature Granulocytes: 0.03 10*3/uL (ref 0.00–0.07)
Basophils Absolute: 0.1 10*3/uL (ref 0.0–0.1)
Basophils Relative: 1 %
Eosinophils Absolute: 0.3 10*3/uL (ref 0.0–0.5)
Eosinophils Relative: 3 %
HCT: 37 % (ref 36.0–46.0)
Hemoglobin: 12 g/dL (ref 12.0–15.0)
Immature Granulocytes: 0 %
Lymphocytes Relative: 11 %
Lymphs Abs: 1.1 10*3/uL (ref 0.7–4.0)
MCH: 25.4 pg — ABNORMAL LOW (ref 26.0–34.0)
MCHC: 32.4 g/dL (ref 30.0–36.0)
MCV: 78.2 fL — ABNORMAL LOW (ref 80.0–100.0)
Monocytes Absolute: 0.3 10*3/uL (ref 0.1–1.0)
Monocytes Relative: 3 %
Neutro Abs: 8.7 10*3/uL — ABNORMAL HIGH (ref 1.7–7.7)
Neutrophils Relative %: 82 %
Platelets: 259 10*3/uL (ref 150–400)
RBC: 4.73 MIL/uL (ref 3.87–5.11)
RDW: 13.8 % (ref 11.5–15.5)
WBC: 10.5 10*3/uL (ref 4.0–10.5)
nRBC: 0 % (ref 0.0–0.2)

## 2023-01-28 LAB — BASIC METABOLIC PANEL
Anion gap: 10 (ref 5–15)
BUN: 7 mg/dL (ref 6–20)
CO2: 19 mmol/L — ABNORMAL LOW (ref 22–32)
Calcium: 8 mg/dL — ABNORMAL LOW (ref 8.9–10.3)
Chloride: 109 mmol/L (ref 98–111)
Creatinine, Ser: 0.9 mg/dL (ref 0.44–1.00)
GFR, Estimated: >60 mL/min (ref >60)
Glucose, Bld: 155 mg/dL — ABNORMAL HIGH (ref 70–99)
Potassium: 2.5 mmol/L — CL (ref 3.5–5.1)
Sodium: 138 mmol/L (ref 135–145)

## 2023-01-28 LAB — SARS CORONAVIRUS 2 BY RT PCR: SARS Coronavirus 2 by RT PCR: NEGATIVE

## 2023-01-28 MED ORDER — ALBUTEROL SULFATE HFA 108 (90 BASE) MCG/ACT IN AERS
2.0000 | INHALATION_SPRAY | Freq: Once | RESPIRATORY_TRACT | Status: AC
  Administered 2023-01-28: 2 via RESPIRATORY_TRACT
  Filled 2023-01-28: qty 6.7

## 2023-01-28 MED ORDER — POTASSIUM CHLORIDE CRYS ER 20 MEQ PO TBCR
40.0000 meq | EXTENDED_RELEASE_TABLET | Freq: Every day | ORAL | 0 refills | Status: DC
Start: 2023-01-28 — End: 2023-02-25

## 2023-01-28 MED ORDER — POTASSIUM CHLORIDE 10 MEQ/100ML IV SOLN
10.0000 meq | INTRAVENOUS | Status: AC
  Administered 2023-01-28 (×2): 10 meq via INTRAVENOUS
  Filled 2023-01-28 (×2): qty 100

## 2023-01-28 MED ORDER — PREDNISONE 20 MG PO TABS: ORAL_TABLET | ORAL | 0 refills | Status: DC

## 2023-01-28 MED ORDER — POTASSIUM CHLORIDE CRYS ER 20 MEQ PO TBCR
80.0000 meq | EXTENDED_RELEASE_TABLET | Freq: Once | ORAL | Status: AC
  Administered 2023-01-28: 80 meq via ORAL
  Filled 2023-01-28: qty 4

## 2023-01-28 NOTE — ED Provider Notes (Incomplete)
12:10 AM Assumed care from Dr. Doran Durand, please see their note for full history, physical and decision making until this point. In brief this is a 47 y.o. year old female who presented to the ED tonight with Shortness of Breath     Pending blood and reeval after treatments for asthma. Solumedrol/Mg with EMS.   Found to have a potassium of 2.5.  Is probably in part related to the amount of albuterol she has had today however is still pretty low.  No EKG changes.  Repleted partially here in the ER and I suspect some of the potassium will come back out of the cells and likely come back up to a normal range within 24 hours.  On multiple repeat evaluations patient is not requiring oxygen, not tachypneic, not hypoxic and has clear lung sounds.  She states that she feels much much better and her husband states that she looks much better as well.  In the end she ended up being under my care for approximately 4 hours without any significant issues.  Will discharge on steroids and albuterol.  Knows to follow-up with PCP but also to return here if new or worsening symptoms.  CRITICAL CARE Performed by: Marily Memos Total critical care time: 35 minutes Critical care time was exclusive of separately billable procedures and treating other patients. Critical care was necessary to treat or prevent imminent or life-threatening deterioration. Critical care was time spent personally by me on the following activities: development of treatment plan with patient and/or surrogate as well as nursing, discussions with consultants, evaluation of patient's response to treatment, examination of patient, obtaining history from patient or surrogate, ordering and performing treatments and interventions, ordering and review of laboratory studies, ordering and review of radiographic studies, pulse oximetry and re-evaluation of patient's condition.   Discharge instructions, including strict return precautions for new or worsening  symptoms, given. Patient and/or family verbalized understanding and agreement with the plan as described.   Labs, studies and imaging reviewed by myself and considered in medical decision making if ordered. Imaging interpreted by radiology.  Labs Reviewed  SARS CORONAVIRUS 2 BY RT PCR  CBC WITH DIFFERENTIAL/PLATELET  BASIC METABOLIC PANEL  PREGNANCY, URINE    DG Chest Portable 1 View  Final Result      No follow-ups on file.    Chenee Munns, Barbara Cower, MD 01/29/23 2130    Marily Memos, MD 01/29/23 [phone]

## 2023-01-28 NOTE — ED Notes (Signed)
Discharge instructions discussed with pt. Verbalized understanding. VSS. No questions or concerns regarding discharge  

## 2023-02-25 ENCOUNTER — Ambulatory Visit: Payer: BC Managed Care – PPO | Admitting: Family Medicine

## 2023-02-25 VITALS — BP 142/60 | HR 67 | Temp 98.1°F | Ht 65.0 in | Wt 169.0 lb

## 2023-02-25 DIAGNOSIS — Z7689 Persons encountering health services in other specified circumstances: Secondary | ICD-10-CM

## 2023-02-25 DIAGNOSIS — J4541 Moderate persistent asthma with (acute) exacerbation: Secondary | ICD-10-CM | POA: Diagnosis not present

## 2023-02-25 DIAGNOSIS — R030 Elevated blood-pressure reading, without diagnosis of hypertension: Secondary | ICD-10-CM | POA: Diagnosis not present

## 2023-02-25 MED ORDER — AIRSUPRA 90-80 MCG/ACT IN AERO: 10.7000 g | INHALATION_SPRAY | Freq: Four times a day (QID) | RESPIRATORY_TRACT | 3 refills | Status: DC | PRN

## 2023-02-25 MED ORDER — PREDNISONE 20 MG PO TABS
20.0000 mg | ORAL_TABLET | Freq: Two times a day (BID) | ORAL | 0 refills | Status: DC
Start: 2023-02-25 — End: 2023-08-26

## 2023-02-25 NOTE — Progress Notes (Signed)
I,Jameka J Llittleton, CMA,acting as a Neurosurgeon for [employer], NP.,have documented all relevant documentation on the behalf of Ellender Hose, NP,as directed by  Ellender Hose, NP while in the presence of Ellender Hose, NP.  Subjective:  Patient ID: Kathleen Cole , female    DOB: 1975/06/13 , 47 y.o.   MRN: [id number]  Chief Complaint  Patient presents with   Asthma    HPI  Patient presents today to establish primary care. Patient would like to be seen for her asthma. She reports she went to the hospital about 2 weeks ago for an asthma flare up. Patient reports having shortness of breathe   and a productive cough sometimes. She reports when she uses the inhaler has not been helping much so she has not been using it as prescribed.  Asthma She complains of cough, shortness of breath and wheezing. The current episode started 1 to 4 weeks ago. The cough is productive. Her past medical history is significant for asthma.     Past Medical History:  Diagnosis Date   Acute deep vein thrombosis (DVT) of proximal end of left lower extremity (HCC) 01/04/2018   12/17/17 provoked extensive proximal DVT 4-5 wks post Hysterectomy complicated by tear in bladder   Anemia    Asthma    rarely uses inhaler -uses when patient gets sick   Seasonal allergies    SVD (spontaneous vaginal delivery)    x 2     Family History  Problem Relation Age of Onset   Lung disease Mother    Pneumonia Father    Cancer Maternal Uncle    Parkinson's disease Maternal Grandfather      Current Outpatient Medications:    Albuterol-Budesonide (AIRSUPRA) 90-80 MCG/ACT AERO, Inhale 10.7 g into the lungs every 6 (six) hours as needed., Disp: 10.7 g, Rfl: 3   predniSONE (DELTASONE) 20 MG tablet, Take 1 tablet (20 mg total) by mouth 2 (two) times daily with a meal., Disp: 7 tablet, Rfl: 0   Allergies  Allergen Reactions   Shellfish Allergy Hives and Itching     Review of Systems  Constitutional: Negative.   Respiratory:   Positive for cough, shortness of breath and wheezing.   Cardiovascular: Negative.   Skin: Negative.   Neurological: Negative.      Today's Vitals   02/25/23 1552 02/25/23 1704  BP: (!) 160/100 (!) 142/60  Pulse: 67   Temp: 98.1 F (36.7 C)   SpO2: 95%   Weight: 169 lb (76.7 kg)   Height: 5\' 5"  (1.651 m)   PainSc: 0-No pain    Body mass index is 28.12 kg/m.  Wt Readings from Last 3 Encounters:  02/25/23 169 lb (76.7 kg)  05/14/18 169 lb (76.7 kg)  01/04/18 160 lb (72.6 kg)    The ASCVD Risk score (Arnett DK, et al., 2019) failed to calculate for the following reasons:   Cannot find a previous HDL lab   Cannot find a previous total cholesterol lab  Objective:  Physical Exam Cardiovascular:     Rate and Rhythm: Normal rate and regular rhythm.  Pulmonary:     Breath sounds: Wheezing present.  Skin:    General: Skin is warm and dry.  Neurological:     General: No focal deficit present.     Mental Status: She is alert and oriented to person, place, and time.         Assessment And Plan:  Establishing care with new doctor, encounter for  Moderate persistent  asthma with acute exacerbation -     Airsupra; Inhale 10.7 g into the lungs every 6 (six) hours as needed.  Dispense: 10.7 g; Refill: 3 -     predniSONE; Take 1 tablet (20 mg total) by mouth 2 (two) times daily with a meal.  Dispense: 7 tablet; Refill: 0  Elevated BP without diagnosis of hypertension Assessment & Plan: Low salt diet advised; return in 1 week for NV for BP check.     Return in about 6 months (around 08/26/2023), or if symptoms worsen or fail to improve, for asthma f/u and 1 week NV BPC.  Patient was given opportunity to ask questions. Patient verbalized understanding of the plan and was able to repeat key elements of the plan. All questions were answered to their satisfaction.    I, Ellender Hose, NP, have reviewed all documentation for this visit. The documentation on 03/02/23 for the exam,  diagnosis, procedures, and orders are all accurate and complete.   IF YOU HAVE BEEN REFERRED TO A SPECIALIST, IT MAY TAKE 1-2 WEEKS TO SCHEDULE/PROCESS THE REFERRAL. IF YOU HAVE NOT HEARD FROM US/SPECIALIST IN TWO WEEKS, PLEASE GIVE [place] A CALL AT [phone].

## 2023-03-05 DIAGNOSIS — J4541 Moderate persistent asthma with (acute) exacerbation: Secondary | ICD-10-CM | POA: Insufficient documentation

## 2023-03-05 DIAGNOSIS — R030 Elevated blood-pressure reading, without diagnosis of hypertension: Secondary | ICD-10-CM | POA: Insufficient documentation

## 2023-03-05 NOTE — Assessment & Plan Note (Signed)
Low salt diet advised; return in 1 week for NV for BP check.

## 2023-03-06 ENCOUNTER — Ambulatory Visit: Payer: BC Managed Care – PPO

## 2023-03-06 VITALS — BP 124/80 | HR 55 | Temp 97.8°F | Ht 65.0 in | Wt 169.0 lb

## 2023-03-06 DIAGNOSIS — R030 Elevated blood-pressure reading, without diagnosis of hypertension: Secondary | ICD-10-CM

## 2023-03-06 NOTE — Progress Notes (Signed)
Patient presents today for a BP check, patient is currently not on any BP medication currently.  BP Readings from Last 3 Encounters:  03/06/23 124/80  02/25/23 (!) 142/60  01/28/23 132/84   Per provider- great - follow up next visit.

## 2023-04-22 ENCOUNTER — Encounter: Payer: BLUE CROSS/BLUE SHIELD | Admitting: Family Medicine

## 2023-04-29 ENCOUNTER — Ambulatory Visit: Payer: BLUE CROSS/BLUE SHIELD | Admitting: Nurse Practitioner

## 2023-08-26 ENCOUNTER — Ambulatory Visit: Payer: BLUE CROSS/BLUE SHIELD | Admitting: Family Medicine

## 2023-08-26 ENCOUNTER — Encounter: Payer: Self-pay | Admitting: Family Medicine

## 2023-08-26 VITALS — BP 122/70 | HR 64 | Temp 98.0°F | Ht 65.0 in | Wt 183.8 lb

## 2023-08-26 DIAGNOSIS — Z2821 Immunization not carried out because of patient refusal: Secondary | ICD-10-CM

## 2023-08-26 DIAGNOSIS — Z6830 Body mass index (BMI) 30.0-30.9, adult: Secondary | ICD-10-CM | POA: Diagnosis not present

## 2023-08-26 DIAGNOSIS — J4540 Moderate persistent asthma, uncomplicated: Secondary | ICD-10-CM

## 2023-08-26 DIAGNOSIS — E6609 Other obesity due to excess calories: Secondary | ICD-10-CM | POA: Diagnosis not present

## 2023-08-26 DIAGNOSIS — E66811 Obesity, class 1: Secondary | ICD-10-CM

## 2023-08-26 DIAGNOSIS — J4541 Moderate persistent asthma with (acute) exacerbation: Secondary | ICD-10-CM

## 2023-08-26 MED ORDER — MONTELUKAST SODIUM 10 MG PO TABS
10.0000 mg | ORAL_TABLET | Freq: Every day | ORAL | 2 refills | Status: DC
Start: 2023-08-26 — End: 2024-02-23

## 2023-08-26 NOTE — Progress Notes (Signed)
 [place], CMA,acting as a Neurosurgeon for Melodie Spry, NP.,have documented all relevant documentation on the behalf of Melodie Spry, NP,as directed by  Melodie Spry, NP while in the presence of Melodie Spry, NP.  Subjective:  Patient ID: Kathleen Cole , female    DOB: [birth year] , 48 y.o.   MRN: [id number]  Chief Complaint  Patient presents with   Asthma    HPI  Patient is 48 year old female who presents today for asthma management. Patient denies any chest pain, SOB, or headaches. Patient reports her asthma has been good she uses airsupra  inhaler as needed for shortness of breath. Will add Montelukast  10 mg every day to daily regimen.      Past Medical History:  Diagnosis Date   Acute deep vein thrombosis (DVT) of proximal end of left lower extremity (HCC) 01/04/2018   12/17/17 provoked extensive proximal DVT 4-5 wks post Hysterectomy complicated by tear in bladder   Anemia    Asthma    rarely uses inhaler -uses when patient gets sick   Seasonal allergies    SVD (spontaneous vaginal delivery)    x 2     Family History  Problem Relation Age of Onset   Lung disease Mother    Pneumonia Father    Cancer Maternal Uncle    Parkinson's disease Maternal Grandfather      Current Outpatient Medications:    Albuterol -Budesonide (AIRSUPRA ) 90-80 MCG/ACT AERO, Inhale 10.7 g into the lungs every 6 (six) hours as needed., Disp: 10.7 g, Rfl: 3   montelukast  (SINGULAIR ) 10 MG tablet, Take 1 tablet (10 mg total) by mouth daily., Disp: 90 tablet, Rfl: 2   Allergies  Allergen Reactions   Shellfish Allergy Hives and Itching     Review of Systems  Constitutional: Negative.   HENT:  Positive for sneezing.   Eyes: Negative.   Respiratory: Negative.    Cardiovascular: Negative.   Skin: Negative.      Today's Vitals   08/26/23 1159  BP: 122/70  Pulse: 64  Temp: 98 F (36.7 C)  TempSrc: Oral  Weight: 183 lb 12.8 oz (83.4 kg)  Height: 5\' 5"  (1.651 m)  PainSc: 0-No pain   Body  mass index is 30.59 kg/m.  Wt Readings from Last 3 Encounters:  08/26/23 183 lb 12.8 oz (83.4 kg)  03/06/23 169 lb (76.7 kg)  02/25/23 169 lb (76.7 kg)    The ASCVD Risk score (Arnett DK, et al., 2019) failed to calculate for the following reasons:   Cannot find a previous HDL lab   Cannot find a previous total cholesterol lab  Objective:  Physical Exam HENT:     Head: Normocephalic.  Pulmonary:     Effort: Pulmonary effort is normal.     Breath sounds: Normal breath sounds.  Abdominal:     General: Bowel sounds are normal.  Skin:    General: Skin is warm and dry.  Neurological:     General: No focal deficit present.     Mental Status: She is alert and oriented to person, place, and time.         Assessment And Plan:  Moderate persistent asthma without complication -     Montelukast  Sodium; Take 1 tablet (10 mg total) by mouth daily.  Dispense: 90 tablet; Refill: 2  COVID-19 vaccination declined  Tetanus, diphtheria, and acellular pertussis (Tdap) vaccination declined  Class 1 obesity due to excess calories with body mass index (BMI) of 30.0 to 30.9 in  adult, unspecified whether serious comorbidity present Assessment & Plan: She is encouraged to strive for BMI less than 30 to decrease cardiac risk. Advised to aim for at least 150 minutes of exercise per week.      Return in about 3 months (around 11/25/2023) for asthma, physical.  Patient was given opportunity to ask questions. Patient verbalized understanding of the plan and was able to repeat key elements of the plan. All questions were answered to their satisfaction.    I, Melodie Spry, NP, have reviewed all documentation for this visit. The documentation on 09/06/2023 for the exam, diagnosis, procedures, and orders are all accurate and complete.    IF YOU HAVE BEEN REFERRED TO A SPECIALIST, IT MAY TAKE 1-2 WEEKS TO SCHEDULE/PROCESS THE REFERRAL. IF YOU HAVE NOT HEARD FROM US /SPECIALIST IN TWO WEEKS, PLEASE GIVE US  A  CALL AT [phone].

## 2023-09-06 DIAGNOSIS — Z2821 Immunization not carried out because of patient refusal: Secondary | ICD-10-CM | POA: Insufficient documentation

## 2023-09-06 DIAGNOSIS — J4540 Moderate persistent asthma, uncomplicated: Secondary | ICD-10-CM | POA: Insufficient documentation

## 2023-09-06 DIAGNOSIS — E66811 Obesity, class 1: Secondary | ICD-10-CM | POA: Insufficient documentation

## 2023-09-06 NOTE — Assessment & Plan Note (Signed)
 She is encouraged to strive for BMI less than 30 to decrease cardiac risk. Advised to aim for at least 150 minutes of exercise per week.

## 2023-11-26 ENCOUNTER — Encounter: Payer: Self-pay | Admitting: Family Medicine

## 2023-11-26 ENCOUNTER — Ambulatory Visit: Admitting: Family Medicine

## 2023-11-26 VITALS — BP 100/70 | HR 76 | Temp 98.5°F | Ht 65.0 in | Wt 183.0 lb

## 2023-11-26 DIAGNOSIS — Z1211 Encounter for screening for malignant neoplasm of colon: Secondary | ICD-10-CM

## 2023-11-26 DIAGNOSIS — Z1159 Encounter for screening for other viral diseases: Secondary | ICD-10-CM | POA: Diagnosis not present

## 2023-11-26 DIAGNOSIS — E559 Vitamin D deficiency, unspecified: Secondary | ICD-10-CM

## 2023-11-26 DIAGNOSIS — Z0000 Encounter for general adult medical examination without abnormal findings: Secondary | ICD-10-CM

## 2023-11-26 DIAGNOSIS — Z833 Family history of diabetes mellitus: Secondary | ICD-10-CM

## 2023-11-26 DIAGNOSIS — E66811 Obesity, class 1: Secondary | ICD-10-CM | POA: Diagnosis not present

## 2023-11-26 DIAGNOSIS — Z1231 Encounter for screening mammogram for malignant neoplasm of breast: Secondary | ICD-10-CM

## 2023-11-26 DIAGNOSIS — J4540 Moderate persistent asthma, uncomplicated: Secondary | ICD-10-CM | POA: Diagnosis not present

## 2023-11-26 DIAGNOSIS — E6609 Other obesity due to excess calories: Secondary | ICD-10-CM

## 2023-11-26 DIAGNOSIS — Z136 Encounter for screening for cardiovascular disorders: Secondary | ICD-10-CM

## 2023-11-26 DIAGNOSIS — Z6830 Body mass index (BMI) 30.0-30.9, adult: Secondary | ICD-10-CM

## 2023-11-26 MED ORDER — AIRSUPRA 90-80 MCG/ACT IN AERO: 10.7000 g | INHALATION_SPRAY | Freq: Four times a day (QID) | RESPIRATORY_TRACT | 3 refills | Status: AC | PRN

## 2023-11-26 NOTE — Progress Notes (Signed)
 I,Kathleen Cole, CMA,acting as a Neurosurgeon for [employer], NP.,have documented all relevant documentation on the behalf of Kathleen Creighton, NP,as directed by  Kathleen Creighton, NP while in the presence of Kathleen Creighton, NP.  Subjective:    Patient ID: Kathleen Cole , female    DOB: 10-24-1975 , 48 y.o.   MRN: [id number]  Chief Complaint  Patient presents with   Annual Exam    HPI  Patient is a 48 year old female who presents today for her annual physical examination. She has a history of asthma and states she has been well controlled.Patient doesn't have any questions or concerns at this time. Patient has a total hysterectomy in 2021 at [facility], Dr Rivard. She does not need pap smears afterwards.     Past Medical History:  Diagnosis Date   Acute deep vein thrombosis (DVT) of proximal end of left lower extremity (HCC) 01/04/2018   12/17/17 provoked extensive proximal DVT 4-5 wks post Hysterectomy complicated by tear in bladder   Anemia    Asthma    rarely uses inhaler -uses when patient gets sick   Seasonal allergies    SVD (spontaneous vaginal delivery)    x 2     Family History  Problem Relation Age of Onset   Lung disease Mother    Pneumonia Father    Cancer Maternal Uncle    Parkinson's disease Maternal Grandfather      Current Outpatient Medications:    montelukast  (SINGULAIR ) 10 MG tablet, Take 1 tablet (10 mg total) by mouth daily., Disp: 90 tablet, Rfl: 2   Albuterol -Budesonide (AIRSUPRA ) 90-80 MCG/ACT AERO, Inhale 10.7 g into the lungs every 6 (six) hours as needed., Disp: 32.1 g, Rfl: 3   Allergies  Allergen Reactions   Shellfish Allergy Hives and Itching       Social History   Tobacco Use  Smoking Status Never  Smokeless Tobacco Never   Social History   Substance and Sexual Activity  Alcohol Use Yes   Comment: occasional wine  .    Review of Systems  Constitutional: Negative.   HENT: Negative.    Eyes: Negative.   Respiratory:  Negative.    Cardiovascular: Negative.   Gastrointestinal: Negative.   Endocrine: Negative.   Genitourinary: Negative.   Musculoskeletal: Negative.   Skin: Negative.   Allergic/Immunologic: Negative.   Neurological: Negative.   Hematological: Negative.   Psychiatric/Behavioral: Negative.       Today's Vitals   11/26/23 1419  BP: 100/70  Pulse: 76  Temp: 98.5 F (36.9 C)  TempSrc: Oral  Weight: 183 lb (83 kg)  Height: 5' 5 (1.651 m)  PainSc: 0-No pain   Body mass index is 30.45 kg/m.  Wt Readings from Last 3 Encounters:  11/26/23 183 lb (83 kg)  08/26/23 183 lb 12.8 oz (83.4 kg)  03/06/23 169 lb (76.7 kg)     Objective:  Physical Exam Constitutional:      Appearance: Normal appearance.  HENT:     Head: Normocephalic.     Nose: Nose normal.  Cardiovascular:     Rate and Rhythm: Normal rate and regular rhythm.     Pulses: Normal pulses.     Heart sounds: Normal heart sounds.  Pulmonary:     Effort: Pulmonary effort is normal.     Breath sounds: Normal breath sounds.  Abdominal:     General: Bowel sounds are normal.  Musculoskeletal:        General: Normal range of  motion.  Skin:    General: Skin is warm and dry.  Neurological:     General: No focal deficit present.     Mental Status: She is alert and oriented to person, place, and time. Mental status is at baseline.  Psychiatric:        Mood and Affect: Mood normal.         Assessment And Plan:     Encounter for general adult medical examination w/o abnormal findings -     CMP14+EGFR -     CBC  Encounter for hepatitis C screening test for low risk patient -     Hepatitis C antibody  Screening for colon cancer -     Cologuard  Encounter for screening for cardiovascular disorders -     Lipid panel  Family history of diabetes mellitus (DM) -     Hemoglobin A1c  Screening mammogram for breast cancer -     3D Screening Mammogram, Left and Right; Future  Vitamin D  deficiency -     VITAMIN D   25 Hydroxy (Vit-D Deficiency, Fractures)  Moderate persistent asthma without complication -     Airsupra ; Inhale 10.7 g into the lungs every 6 (six) hours as needed.  Dispense: 32.1 g; Refill: 3  Class 1 obesity due to excess calories with body mass index (BMI) of 30.0 to 30.9 in adult, unspecified whether serious comorbidity present Assessment & Plan: She is encouraged to maintain a BMI of 30 or less to decrease cardiac risk. Advised to aim for at least 150 minutes of exercise per week.       Return for 1 year physical. Patient was given opportunity to ask questions. Patient verbalized understanding of the plan and was able to repeat key elements of the plan. All questions were answered to their satisfaction.   I, Kathleen Creighton, NP, have reviewed all documentation for this visit. The documentation on 12/06/2023 for the exam, diagnosis, procedures, and orders are all accurate and complete.

## 2023-11-26 NOTE — Patient Instructions (Signed)

## 2023-11-27 LAB — HEPATITIS C ANTIBODY: Hep C Virus Ab: NONREACTIVE

## 2023-11-27 LAB — CMP14+EGFR
ALT: 13 IU/L (ref 0–32)
AST: 16 IU/L (ref 0–40)
Albumin: 4.4 g/dL (ref 3.9–4.9)
Alkaline Phosphatase: 61 IU/L (ref 44–121)
BUN/Creatinine Ratio: 14 (ref 9–23)
BUN: 14 mg/dL (ref 6–24)
Bilirubin Total: 0.4 mg/dL (ref 0.0–1.2)
CO2: 23 mmol/L (ref 20–29)
Calcium: 9.3 mg/dL (ref 8.7–10.2)
Chloride: 103 mmol/L (ref 96–106)
Creatinine, Ser: 0.99 mg/dL (ref 0.57–1.00)
Globulin, Total: 2.3 g/dL (ref 1.5–4.5)
Glucose: 72 mg/dL (ref 70–99)
Potassium: 4.3 mmol/L (ref 3.5–5.2)
Sodium: 139 mmol/L (ref 134–144)
Total Protein: 6.7 g/dL (ref 6.0–8.5)
eGFR: 70 mL/min/1.73 (ref >59)

## 2023-11-27 LAB — LIPID PANEL
Chol/HDL Ratio: 3.8 ratio (ref 0.0–4.4)
Cholesterol, Total: 179 mg/dL (ref 100–199)
HDL: 47 mg/dL (ref >39)
LDL Chol Calc (NIH): 106 mg/dL — ABNORMAL HIGH (ref 0–99)
Triglycerides: 145 mg/dL (ref 0–149)
VLDL Cholesterol Cal: 26 mg/dL (ref 5–40)

## 2023-11-27 LAB — CBC
Hematocrit: 37.3 % (ref 34.0–46.6)
Hemoglobin: 11.4 g/dL (ref 11.1–15.9)
MCH: 24.6 pg — ABNORMAL LOW (ref 26.6–33.0)
MCHC: 30.6 g/dL — ABNORMAL LOW (ref 31.5–35.7)
MCV: 81 fL (ref 79–97)
Platelets: 197 x10E3/uL (ref 150–450)
RBC: 4.63 x10E6/uL (ref 3.77–5.28)
RDW: 14.3 % (ref 11.7–15.4)
WBC: 6.8 x10E3/uL (ref 3.4–10.8)

## 2023-11-27 LAB — HEMOGLOBIN A1C
Est. average glucose Bld gHb Est-mCnc: 117 mg/dL
Hgb A1c MFr Bld: 5.7 % — ABNORMAL HIGH (ref 4.8–5.6)

## 2023-11-27 LAB — VITAMIN D 25 HYDROXY (VIT D DEFICIENCY, FRACTURES): Vit D, 25-Hydroxy: 8 ng/mL — ABNORMAL LOW (ref 30.0–100.0)

## 2023-12-04 ENCOUNTER — Encounter: Payer: Self-pay | Admitting: Advanced Practice Midwife

## 2023-12-07 ENCOUNTER — Ambulatory Visit: Payer: Self-pay | Admitting: Family Medicine

## 2023-12-07 MED ORDER — VITAMIN D (ERGOCALCIFEROL) 1.25 MG (50000 UNIT) PO CAPS: 50000.0000 [IU] | ORAL_CAPSULE | ORAL | 5 refills | Status: AC

## 2023-12-07 NOTE — Progress Notes (Signed)
 Vitamin D  level is very low, once weekly vitamin D  supplement has been sent to the pharmacy.  Your A1c is 5.7 this is prediabetes, and your LDL which is the bad cholesterol is 106 and this is slightly elevated, so low-fat diet and low carbohydrate diet with exercise highly advised.  Your iron level is slightly low eat foods rich in iron such as green leafy vegetables iron fortified cereals etc. Thank you

## 2023-12-07 NOTE — Assessment & Plan Note (Addendum)
 She is encouraged to maintain a BMI of 30 or less to decrease cardiac risk. Advised to aim for at least 150 minutes of exercise per week.

## 2023-12-16 DIAGNOSIS — Z1211 Encounter for screening for malignant neoplasm of colon: Secondary | ICD-10-CM | POA: Diagnosis not present

## 2023-12-22 LAB — COLOGUARD: COLOGUARD: NEGATIVE

## 2024-02-22 ENCOUNTER — Other Ambulatory Visit: Payer: Self-pay | Admitting: Family Medicine

## 2024-02-22 DIAGNOSIS — J4540 Moderate persistent asthma, uncomplicated: Secondary | ICD-10-CM

## 2024-11-30 ENCOUNTER — Encounter: Payer: Self-pay | Admitting: Family Medicine
